# Patient Record
Sex: Male | Born: 1950
Health system: Southern US, Community
[De-identification: ages and names within clinical notes are randomized; demographics above are authoritative.]

## PROBLEM LIST (undated history)

## (undated) DIAGNOSIS — E669 Obesity, unspecified: Secondary | ICD-10-CM

## (undated) DIAGNOSIS — M1990 Unspecified osteoarthritis, unspecified site: Secondary | ICD-10-CM

## (undated) DIAGNOSIS — Z7901 Long term (current) use of anticoagulants: Secondary | ICD-10-CM

## (undated) DIAGNOSIS — Z720 Tobacco use: Secondary | ICD-10-CM

## (undated) DIAGNOSIS — K4090 Unilateral inguinal hernia, without obstruction or gangrene, not specified as recurrent: Secondary | ICD-10-CM

## (undated) DIAGNOSIS — E785 Hyperlipidemia, unspecified: Secondary | ICD-10-CM

## (undated) DIAGNOSIS — Z86010 Personal history of colonic polyps: Secondary | ICD-10-CM

## (undated) DIAGNOSIS — M1A9XX0 Chronic gout, unspecified, without tophus (tophi): Secondary | ICD-10-CM

## (undated) DIAGNOSIS — R42 Dizziness and giddiness: Secondary | ICD-10-CM

## (undated) DIAGNOSIS — I10 Essential (primary) hypertension: Secondary | ICD-10-CM

## (undated) DIAGNOSIS — Z860101 Personal history of adenomatous and serrated colon polyps: Secondary | ICD-10-CM

## (undated) DIAGNOSIS — Z8719 Personal history of other diseases of the digestive system: Secondary | ICD-10-CM

## (undated) DIAGNOSIS — K219 Gastro-esophageal reflux disease without esophagitis: Secondary | ICD-10-CM

## (undated) DIAGNOSIS — M109 Gout, unspecified: Secondary | ICD-10-CM

## (undated) DIAGNOSIS — E7800 Pure hypercholesterolemia, unspecified: Secondary | ICD-10-CM

## (undated) DIAGNOSIS — R112 Nausea with vomiting, unspecified: Secondary | ICD-10-CM

## (undated) DIAGNOSIS — G4733 Obstructive sleep apnea (adult) (pediatric): Secondary | ICD-10-CM

## (undated) HISTORY — DX: Pure hypercholesterolemia, unspecified: E78.00

## (undated) HISTORY — PX: COLONOSCOPY: SHX174

## (undated) HISTORY — DX: Gout, unspecified: M10.9

## (undated) HISTORY — DX: Tobacco use: Z72.0

## (undated) HISTORY — DX: Unilateral inguinal hernia, without obstruction or gangrene, not specified as recurrent: K40.90

## (undated) HISTORY — DX: Personal history of other diseases of the digestive system: Z87.19

## (undated) HISTORY — DX: Dizziness and giddiness: R42

## (undated) HISTORY — DX: Unspecified osteoarthritis, unspecified site: M19.90

## (undated) HISTORY — DX: Obesity, unspecified: E66.9

## (undated) HISTORY — DX: Essential (primary) hypertension: I10

## (undated) HISTORY — PX: CHOLECYSTECTOMY: SHX55

---

## 1993-06-06 HISTORY — PX: LAPAROSCOPIC CHOLECYSTECTOMY: SUR755

## 1994-05-19 ENCOUNTER — Encounter: Payer: Self-pay | Admitting: Internal Medicine

## 2004-09-13 ENCOUNTER — Ambulatory Visit: Payer: Self-pay | Admitting: Internal Medicine

## 2004-11-04 ENCOUNTER — Encounter (INDEPENDENT_AMBULATORY_CARE_PROVIDER_SITE_OTHER): Payer: Self-pay | Admitting: *Deleted

## 2004-11-04 ENCOUNTER — Ambulatory Visit: Payer: Self-pay | Admitting: Internal Medicine

## 2005-06-06 HISTORY — PX: COLONOSCOPY: SHX174

## 2006-05-14 ENCOUNTER — Emergency Department (HOSPITAL_COMMUNITY): Admission: EM | Admit: 2006-05-14 | Discharge: 2006-05-14 | Payer: Self-pay | Admitting: Family Medicine

## 2008-08-20 ENCOUNTER — Ambulatory Visit: Payer: Self-pay | Admitting: Internal Medicine

## 2008-08-20 LAB — CONVERTED CEMR LAB
Albumin: 4.1 g/dL (ref 3.5–5.2)
Alkaline Phosphatase: 64 units/L (ref 39–117)
CO2: 30 meq/L (ref 19–32)
CRP, High Sensitivity: 6 — ABNORMAL HIGH (ref 0.00–5.00)
Calcium: 9.1 mg/dL (ref 8.4–10.5)
Creatinine, Ser: 0.8 mg/dL (ref 0.4–1.5)
Eosinophils Absolute: 0.1 10*3/uL (ref 0.0–0.7)
GFR calc non Af Amer: 106.22 mL/min (ref >60)
HCT: 44.9 % (ref 39.0–52.0)
HDL: 27.8 mg/dL — ABNORMAL LOW (ref >39.00)
Lymphocytes Relative: 22.4 % (ref 12.0–46.0)
MCV: 90 fL (ref 78.0–100.0)
Monocytes Absolute: 0.6 10*3/uL (ref 0.1–1.0)
Monocytes Relative: 8.9 % (ref 3.0–12.0)
Neutro Abs: 4.4 10*3/uL (ref 1.4–7.7)
Platelets: 184 10*3/uL (ref 150.0–400.0)
RBC: 4.99 M/uL (ref 4.22–5.81)
Total Protein: 7 g/dL (ref 6.0–8.3)
Triglycerides: 145 mg/dL (ref 0.0–149.0)
WBC: 6.6 10*3/uL (ref 4.5–10.5)

## 2008-08-29 ENCOUNTER — Ambulatory Visit: Payer: Self-pay | Admitting: Internal Medicine

## 2008-08-29 ENCOUNTER — Encounter: Payer: Self-pay | Admitting: Internal Medicine

## 2008-08-29 DIAGNOSIS — E785 Hyperlipidemia, unspecified: Secondary | ICD-10-CM | POA: Insufficient documentation

## 2008-08-29 DIAGNOSIS — F17200 Nicotine dependence, unspecified, uncomplicated: Secondary | ICD-10-CM

## 2008-10-09 ENCOUNTER — Ambulatory Visit: Payer: Self-pay

## 2008-10-09 ENCOUNTER — Ambulatory Visit: Payer: Self-pay | Admitting: Internal Medicine

## 2008-11-18 ENCOUNTER — Ambulatory Visit: Payer: Self-pay | Admitting: Internal Medicine

## 2008-11-18 DIAGNOSIS — E7800 Pure hypercholesterolemia, unspecified: Secondary | ICD-10-CM

## 2008-11-19 LAB — CONVERTED CEMR LAB
Cholesterol: 137 mg/dL (ref 0–200)
LDL Cholesterol: 79 mg/dL (ref 0–99)
Total Bilirubin: 1.1 mg/dL (ref 0.3–1.2)
Total CHOL/HDL Ratio: 4
Triglycerides: 117 mg/dL (ref 0.0–149.0)

## 2008-11-24 ENCOUNTER — Ambulatory Visit: Payer: Self-pay | Admitting: Internal Medicine

## 2009-03-18 ENCOUNTER — Encounter (INDEPENDENT_AMBULATORY_CARE_PROVIDER_SITE_OTHER): Payer: Self-pay | Admitting: *Deleted

## 2009-10-27 ENCOUNTER — Encounter (INDEPENDENT_AMBULATORY_CARE_PROVIDER_SITE_OTHER): Payer: Self-pay | Admitting: *Deleted

## 2010-03-23 ENCOUNTER — Ambulatory Visit: Payer: Self-pay | Admitting: Sports Medicine

## 2010-03-23 DIAGNOSIS — M545 Low back pain: Secondary | ICD-10-CM

## 2010-06-27 ENCOUNTER — Encounter: Payer: Self-pay | Admitting: Sports Medicine

## 2010-07-05 ENCOUNTER — Telehealth: Payer: Self-pay | Admitting: Internal Medicine

## 2010-07-08 NOTE — Letter (Signed)
Summary: Colonoscopy Date Change Letter  [facility]  [address] [place], [place] [address]   Phone: [phone]  Fax: [phone]      Oct 27, 2009 MRN: [id number]   Jim Nguyen [address] [place], [place]  [address]   Dear [name],   Previously you were recommended to have a repeat colonoscopy around this time. Your chart was recently reviewed by Dr. Hedwig Morton. Juanda Chance of  Gastroenterology. Follow up colonoscopy is now recommended in June 2013. This revised recommendation is based on current, nationally recognized guidelines for colorectal cancer screening and polyp surveillance. These guidelines are endorsed by the American Cancer Society, [employer] on Colorectal Cancer as well as numerous other major medical organizations.  Please understand that our recommendation assumes that you do not have any new symptoms such as bleeding, a change in bowel habits, anemia, or significant abdominal discomfort. If you do have any concerning GI symptoms or want to discuss the guideline recommendations, please call to arrange an office visit at your earliest convenience. Otherwise we will keep you in our reminder system and contact you 1-2 months prior to the date listed above to schedule your next colonoscopy.  Thank you,  Hedwig Morton. Juanda Chance, M.D.  [facility] [phone]

## 2010-07-08 NOTE — Assessment & Plan Note (Signed)
Summary: NP GOLFER PRE TOURNAMENT/LOW BACK PAIN/MJD   Vital Signs:  Patient profile:   60 year old male Height:      70 inches Weight:      220 pounds BMI:     31.68 BP sitting:   187 / 95  Vitals Entered By: Lillia Pauls CMA (March 23, 2010 11:57 AM)  History of Present Illness: Low back pain x 2 weeks following multiple golf swings.  Felt this may have arose from trying new clubs Tried rest, and chiropracter and accupuncture.  Also has been taking a 12 day presnisone taper prescribed in past by Dr Farris Has  Back pain mostly 20-30 swings into a golf round. Has been unable to complete a round of golf since the injury.  However he has been resting the back and pain level is now 1/10 no sciatica or red flags   Has big golf tournament this Friday, Saturday and Sunday that he wants to complete.   Current Medications (verified): 1)  Fish Oil   Oil (Fish Oil) .... 1200mg  3 Tabs Daily 2)  Aspirin 81 Mg  Tabs (Aspirin) .Marland Kitchen.. 1 Once Daily 3)  Lipitor 10 Mg Tabs (Atorvastatin Calcium) .... Take One Tablet By Mouth Daily. 4)  Mobic 15 Mg Tabs (Meloxicam) .Marland Kitchen.. 1 By Mouth Daily X10 Days Then As Needed Daily For Pain  Allergies (verified): No Known Drug Allergies  Past History:  Past Medical History: Last updated: 11/24/2008 1. Obesity 2. H/o galsstone s/p cholecystectomy 3. Tobacco use 4. Hyperlipidemia 5. Hypertension 6. ETT 5/10: walked 9:30 no ST-T abnormalities  Review of Systems  The patient denies anorexia, fever, weight loss, syncope, and abdominal pain.    Physical Exam  General:  Vs noted. BP elevated.  Msk:  No deformaties noted  and no skin changes noted.  Mildy TTP over the right SI joint. Paraspinus muscles are mildly tight. Straight leg and contralateral raise tests are neg.  FABER neg, Pretzle stretch has minor twinge in right SI joint.  Stork test is mildly pos in right SI  norm strength and motion  no weakness or signs of discogenic pain   Impression  & Recommendations:  Problem # 1:  LOW BACK PAIN, ACUTE (ICD-724.2) Assessment New SI joint irritation.  Plan Mobic x10 days in conjunction with pred taper.  Showed pretzle stretch as well as hip rotation stretch.  Encourage heat packs and massage.  See instructions.  Discussed red flags with pt.  Will follow up as needed.   His updated medication list for this problem includes:    Aspirin 81 Mg Tabs (Aspirin) .Marland Kitchen... 1 once daily    Mobic 15 Mg Tabs (Meloxicam) .Marland Kitchen... 1 by mouth daily x10 days then as needed daily for pain  Complete Medication List: 1)  Fish Oil Oil (Fish oil) .... 1200mg  3 tabs daily 2)  Aspirin 81 Mg Tabs (Aspirin) .Marland Kitchen.. 1 once daily 3)  Lipitor 10 Mg Tabs (Atorvastatin calcium) .... Take one tablet by mouth daily. 4)  Mobic 15 Mg Tabs (Meloxicam) .Marland Kitchen.. 1 by mouth daily x10 days then as needed daily for pain  Patient Instructions: 1)  Thank you for seeing me today. 2)  Do those stretches that we talked about... standing hip rotation and the pretzel stretch.  3)  Take the mobic daily x10 days. 4)  Apply ice for 10 mins following playing.  5)  Try heat wraps to the backs as needed and each nite of tournament. 6)  Sports massage may be helpful.  7)  Come back if not improved.  8)  Good luck this weekend. Prescriptions: MOBIC 15 MG TABS (MELOXICAM) 1 by mouth daily x10 days then as needed daily for pain  #30 x 0   Entered by:   Clementeen Graham MD   Authorized by:   Enid Baas MD   Signed by:   Clementeen Graham MD on 03/23/2010   Method used:   Electronically to        [facility] [address] [phone]* (retail)       [address]       [place], [place]  [address]       Ph: [phone] or [phone]       Fax: [phone]   RxID:   [id number]    Orders Added: 1)  New Patient Level III [phone]

## 2010-07-14 NOTE — Progress Notes (Signed)
Summary: pt's wife calling re plavix***spouse 2nd call**   Phone Note Call from Patient   Caller: Jim Nguyen [phone] Reason for Call: Talk to Nurse Summary of Call: pt received plavix from [facility] with a bill for $88,  they don't use [facility], they use [facility] where she can get plavix for $4 a month, they need to know what to do since they didn't request the order-she called [facility] and was told we sent it in- Initial call taken by: Glynda Jaeger,  July 05, 2010 8:38 AM  Follow-up for Phone Call        Phone Call Completed SPOKE WITH PT'S WIFE  ACTUALLY MED WAS LIPITOR 10 MG NOT PLAVIX .INFORMED WIFE WOULD CALL [facility] TO SEE IF MED COULD BE MAILED BACK.    Follow-up by: Scherrie Bateman, LPN,  July 05, 2010 5:02 PM  Additional Follow-up for Phone Call Additional follow up Details #1::        Phone Call Completed PER [facility]  WILL MAIL PT ENVELOPE  TO PUT MED INTO  TO MAIL BACK TO [place]. PT'S WIFE AWARE OF ABOVE. Additional Follow-up by: Scherrie Bateman, LPN,  July 05, 2010 5:07 PM

## 2011-03-06 ENCOUNTER — Other Ambulatory Visit: Payer: Self-pay | Admitting: Internal Medicine

## 2011-03-07 ENCOUNTER — Emergency Department (HOSPITAL_COMMUNITY)
Admission: EM | Admit: 2011-03-07 | Discharge: 2011-03-07 | Disposition: A | Discharge status: Home / Self Care | Payer: PRIVATE HEALTH INSURANCE | Attending: Emergency Medicine | Admitting: Emergency Medicine

## 2011-03-07 ENCOUNTER — Emergency Department (HOSPITAL_COMMUNITY): Payer: PRIVATE HEALTH INSURANCE

## 2011-03-07 DIAGNOSIS — E7800 Pure hypercholesterolemia, unspecified: Secondary | ICD-10-CM | POA: Insufficient documentation

## 2011-03-07 DIAGNOSIS — H5500 Unspecified nystagmus: Secondary | ICD-10-CM | POA: Insufficient documentation

## 2011-03-07 DIAGNOSIS — R42 Dizziness and giddiness: Secondary | ICD-10-CM | POA: Insufficient documentation

## 2011-03-07 DIAGNOSIS — R61 Generalized hyperhidrosis: Secondary | ICD-10-CM | POA: Insufficient documentation

## 2011-03-07 DIAGNOSIS — Z79899 Other long term (current) drug therapy: Secondary | ICD-10-CM | POA: Insufficient documentation

## 2011-03-07 DIAGNOSIS — R112 Nausea with vomiting, unspecified: Secondary | ICD-10-CM | POA: Insufficient documentation

## 2011-03-07 LAB — CBC
HCT: 44.8 % (ref 39.0–52.0)
Hemoglobin: 15.9 g/dL (ref 13.0–17.0)
MCH: 32 pg (ref 26.0–34.0)
MCV: 90.1 fL (ref 78.0–100.0)
RDW: 13.2 % (ref 11.5–15.5)

## 2011-03-07 LAB — BASIC METABOLIC PANEL
BUN: 14 mg/dL (ref 6–23)
Chloride: 104 mEq/L (ref 96–112)
Creatinine, Ser: 0.82 mg/dL (ref 0.50–1.35)
Glucose, Bld: 152 mg/dL — ABNORMAL HIGH (ref 70–99)
Potassium: 4.1 mEq/L (ref 3.5–5.1)

## 2011-03-07 LAB — CK TOTAL AND CKMB (NOT AT ARMC): Relative Index: 1.9 (ref 0.0–2.5)

## 2011-03-07 MED ORDER — IOHEXOL 300 MG/ML  SOLN: 65.0000 mL | Freq: Once | INTRAMUSCULAR | Status: DC | PRN

## 2011-03-07 MED ORDER — IOHEXOL 350 MG/ML SOLN: 70.0000 mL | Freq: Once | INTRAVENOUS | Status: DC | PRN

## 2011-03-08 ENCOUNTER — Ambulatory Visit (HOSPITAL_COMMUNITY)
Admission: RE | Admit: 2011-03-08 | Discharge: 2011-03-08 | Disposition: A | Discharge status: Home / Self Care | Payer: PRIVATE HEALTH INSURANCE | Source: Ambulatory Visit | Attending: Emergency Medicine | Admitting: Emergency Medicine

## 2011-03-08 ENCOUNTER — Telehealth: Payer: Self-pay | Admitting: Internal Medicine

## 2011-03-08 DIAGNOSIS — F17200 Nicotine dependence, unspecified, uncomplicated: Secondary | ICD-10-CM | POA: Insufficient documentation

## 2011-03-08 DIAGNOSIS — R42 Dizziness and giddiness: Secondary | ICD-10-CM

## 2011-03-08 DIAGNOSIS — E785 Hyperlipidemia, unspecified: Secondary | ICD-10-CM | POA: Insufficient documentation

## 2011-03-08 NOTE — Telephone Encounter (Signed)
Pt wife calling to schedule appt for [facility] w/ Dr. Gala Romney. Pt was made aware that Dr. Gala Romney doesn't have any available appts and I could schedule pt w/ a PA. Pt wife refused and continued to state "so what you are saying is Dr. Gala Romney doesn't want to see my husband". Pt was very upset and not satisfied that pt could not schedule appt w Dr. Gala Romney this week. Please return pt call to discuss further.

## 2011-03-08 NOTE — Telephone Encounter (Signed)
Spoke with pt, he understands that dr bensimhon is usually in the hosp now with CHF. The pt would like to see someone. He was seen in the ER yesterday for dizziness. He also reports times that it feels his heart is beating irregular. He is therefore scheduled to see dr [place] tomorrow. Deliah Goody

## 2011-03-09 ENCOUNTER — Encounter: Payer: Self-pay | Admitting: Cardiology

## 2011-03-09 ENCOUNTER — Ambulatory Visit (INDEPENDENT_AMBULATORY_CARE_PROVIDER_SITE_OTHER): Payer: PRIVATE HEALTH INSURANCE | Admitting: Cardiology

## 2011-03-09 VITALS — BP 152/90 | HR 60 | Ht 71.0 in | Wt 228.0 lb

## 2011-03-09 DIAGNOSIS — E7800 Pure hypercholesterolemia, unspecified: Secondary | ICD-10-CM

## 2011-03-09 DIAGNOSIS — IMO0001 Reserved for inherently not codable concepts without codable children: Secondary | ICD-10-CM | POA: Insufficient documentation

## 2011-03-09 DIAGNOSIS — R42 Dizziness and giddiness: Secondary | ICD-10-CM

## 2011-03-09 DIAGNOSIS — F17200 Nicotine dependence, unspecified, uncomplicated: Secondary | ICD-10-CM

## 2011-03-09 DIAGNOSIS — R030 Elevated blood-pressure reading, without diagnosis of hypertension: Secondary | ICD-10-CM

## 2011-03-09 NOTE — Patient Instructions (Signed)
Stop smoking   Work on [employer] with goal of weight loss. Reduce carbohydrates and sweets. Follow a mediterranean diet.  Regular aerobic exercise 30-40 minutes a day.  Keep a diary of your blood pressure readings.  I will see you again in 3 weeks with fasting lab work.

## 2011-03-09 NOTE — Assessment & Plan Note (Signed)
He has been on chronic statin therapy. He has not had any regular followup of this. When he returns in 3 weeks we will check a fasting lipid level and hepatic function. Since he also has a family history of hemochromatosis we will check a ferritin level. We will also check a TSH.

## 2011-03-09 NOTE — Assessment & Plan Note (Signed)
Blood pressure is elevated today. He reports that when he has checked his blood pressure at home and has been normal. At this point I've recommended lifestyle modification with tobacco cessation, low-sodium, heart healthy diet, and regular aerobic exercise. He is going to monitor his blood pressure daily and keep a diary. We will have him return in 3 weeks to review this.

## 2011-03-09 NOTE — Progress Notes (Signed)
   Jim Nguyen Date of Birth: [birth year]   History of Present Illness: Jim Nguyen is seen today as a work in. He is a pleasant 60 year old white male with history of tobacco use and hyperlipidemia. On Monday he reports that he developed a sensation that his left ear was clogged. While taking a shower he developed dizziness that persisted for about 2 hours. He became very sweaty and developed nausea and vomited once. His wife checked his blood pressure and it was elevated to 188/102. He was seen in the emergency department. He was found to have a nystagmus which resolved. He had a cranial CT and MRI which were negative. He also had an echocardiogram which was unremarkable. He has had no recurrent symptoms. He denies any prior history of hypertension. He does not check his blood pressure regularly. He also doesn't seek regular medical care but seeks specialty care when needed. He did have an excess stress test here in 2010 which was normal.  Current Outpatient Prescriptions on File Prior to Visit  Medication Sig Dispense Refill  . LIPITOR 10 MG tablet TAKE 1 TABLET EVERY DAY  90 tablet  0    No Known Allergies  Past Medical History  Diagnosis Date  . Vertigo   . Hypercholesterolemia   . Obesity   . History of gallstones   . Tobacco user   . Hypertension     Past Surgical History  Procedure Date  . Cholecystectomy     History  Smoking status  . Current Everyday Smoker -- 0.5 packs/day  Smokeless tobacco  . Not on file    History  Alcohol Use  . 1.2 oz/week  . 2 Glasses of wine per week    Occas.    History reviewed. No pertinent family history.  Review of Systems: The review of systems is positive for sedentary lifestyle. He plans to quit smoking. All other systems were reviewed and are negative.  Physical Exam: BP 152/90  Pulse 60  Ht 5\' 11"  (1.803 m)  Wt 228 lb (103.42 kg)  BMI 31.80 kg/m2 He is an overweight white male in no acute distress.The patient is  alert and oriented x 3.  The mood and affect are normal.  The skin is warm and dry.  Color is normal.  The HEENT exam reveals that the sclera are nonicteric.  Pupils equal round reactive. There is no nystagmus. The mucous membranes are moist.  The carotids are 2+ without bruits.  There is no thyromegaly.  There is no JVD.  The lungs are clear.  The chest wall is non tender.  The heart exam reveals a regular rate with a normal S1 and S2.  There are no murmurs, gallops, or rubs.  The PMI is not displaced.   Abdominal exam reveals good bowel sounds.  There is no guarding or rebound.  There is no hepatosplenomegaly or tenderness.  There are no masses.  Exam of the legs reveal no clubbing, cyanosis, or edema.  The legs are without rashes.  The distal pulses are intact.  Cranial nerves II - XII are intact.  Motor and sensory functions are intact.  The gait is normal.  LABORATORY DATA: ECG demonstrates normal sinus rhythm with a normal ECG. Echocardiogram was reviewed and showed mild left atrial margin. It was otherwise normal.  Assessment / Plan:

## 2011-03-09 NOTE — Assessment & Plan Note (Signed)
Patient states that he has stopped smoking. Encouraged him with this effort.

## 2011-03-09 NOTE — Assessment & Plan Note (Signed)
Recent episode is most likely acute vertigo. He possibly could have had a posterior TIA. He will continue with antiplatelet therapy with aspirin. We'll monitor closely for any recurrent symptoms.

## 2011-03-16 NOTE — Consult Note (Signed)
NAMEADYAN, PALAU NO.:  [id number]  MEDICAL RECORD NO.:  [id number]  LOCATION:  [facility]                         FACILITY:  [facility]  PHYSICIAN:  Thana Farr, MD    DATE OF BIRTH:  [birth year]  DATE OF CONSULTATION: DATE OF DISCHARGE:  03/07/2011                                CONSULTATION   REASON FOR CONSULTATION:  Nystagmus.  HISTORY OF PRESENT ILLNESS:  This is a 60 year old Caucasian male with past medical history of vertigo, cholecystectomy, and hypercholesterolemia.  The patient does smoke one-half pack per day. Drinks alcohol occasionally, but states he does not do illicit drugs. The patient awoke feeling his normal self this morning with the exception of fullness in his left ear.  The patient stepped into his shower and noted that he felt slightly off balance, but noted no vertigo or vertiginous sensation.  The patient took his normal shower, got out of the shower, continued to feel slightly off balance and fullness in his left ear.  The patient shaved himself, got dressed, approximately 10 minutes later noted he was very diaphoretic.  His wife also noted that he was significantly diaphoretic and took his blood pressure.  His blood pressure rang showed 188/102.  Due to fear that the patient might be having a stroke, the patient's wife decided that she is going to drive him to the hospital.  Before they came to the hospital, she did give him an orange juice thinking  his blood glucose might be low.  After drinking the orange juice the patient became nauseous and vomited.  He did state that it was mostly a gastric issue and it was not secondary to his dizziness.  Upon entering the emergency department, CT of his head was negative.  His exam was normal with exception of the ED physician noting left-beating nystagmus.  For this reason, Neuro was called and it was recommended that they obtain an MRI of brain without contrast.  At the time of consultation,  the patient's MRI of brain has come back and is within normal limits showing no acute infarct.  The patient is also back to his baseline and has no residual symptoms.  PAST MEDICAL HISTORY:  As noted above.  MEDICATIONS: 1. Aspirin 81 mg daily. 2. Lipitor 10 mg daily.  ALLERGIES:  To date, he has no known allergies.  SOCIAL HISTORY:  He smokes approximately one-half pack per day.  Drinks occasional alcohol.  Does not do any illicit drugs.  Lives with his wife.  REVIEW OF SYSTEMS:  Negative with exception above.  PHYSICAL EXAMINATION:  VITAL SIGNS:  Blood pressure is 143/68, pulse 72, respirations 18, temperature 98.2. GENERAL:  The patient is alert and oriented x3, carries out 2 and 3-step commands without difficulty. NEUROLOGIC:  Pupils are equal, round, and reactive to light and accommodating.  Conjugate gaze.  Extraocular movements are grossly intact.  Visual fields grossly intact.  Face is symmetrical.  Tongue is midline.  Uvula is midline.  Speech is clear.  Sensation V1-V3 is full. Shoulder shrug and head turn within normal limits.  Coordination:Finger-to-nose and heel-to-shin were smooth.  The patient's motor movement is strong, 5/5.  His deep tendon  reflexes were all 2+ throughout, and he had brisk downgoing toes.  He showed no drift in the upper and lower extremities.  His sensation in his upper extremities and lower extremities bilaterally was grossly intact.  LABORATORY DATA:  Sodium is 138, potassium 4.1, chloride 104, CO2 is 25, BUN 14, creatinine 1.82, glucose 152.  White blood cell count is 7.8, platelets 159, hemoglobin 15.9, hematocrit 44.8.  IMAGING:  MRI of head is negative.  CT of head is negative.  ASSESSMENT/RECOMMENDATIONS:  This is a pleasant 60 year old male whose past medical history includes hyperlipidemia and 2 episodes of vertigo.  The patient awoke with a fullness in his left ear.  After taking a shower, the patient started to profusely become  diaphoretic and noted imbalance. All symptoms have resolved at this time, and the patient's MRI is negative.  At this time, we cannot fully rule out brainstem transient ischemic attack.  For this reason, we would recommend: 1. CTA of head and neck  2. A 2-D echo to look for embolic source. 3. Would recommend increasing aspirin to 325 p.o. daily.  Recommendation has been given to the emergency department MD and the patient will be placed in the CDU until these tests are done.  If these tests show any abnormalities, further workup will be needed.  If workup is negative, the patient may be discharged home and follow up with his primary care doctor as an outpatient.  Dr. Thad Ranger seen and evaluated the patient and agrees with the above-mentioned exam.     Felicie Morn, PA-C   ______________________________ Thana Farr, MD    DS/MEDQ  D:  03/07/2011  T:  03/08/2011  Job:  [id number]  Electronically Signed by Felicie Morn PA-C on 03/08/2011 12:17:19 PM Electronically Signed by Thana Farr MD on 03/16/2011 01:52:25 PM

## 2011-03-29 ENCOUNTER — Telehealth: Payer: Self-pay | Admitting: *Deleted

## 2011-03-29 ENCOUNTER — Other Ambulatory Visit (INDEPENDENT_AMBULATORY_CARE_PROVIDER_SITE_OTHER): Payer: PRIVATE HEALTH INSURANCE | Admitting: *Deleted

## 2011-03-29 DIAGNOSIS — E7800 Pure hypercholesterolemia, unspecified: Secondary | ICD-10-CM

## 2011-03-29 DIAGNOSIS — R030 Elevated blood-pressure reading, without diagnosis of hypertension: Secondary | ICD-10-CM

## 2011-03-29 LAB — HEPATIC FUNCTION PANEL
ALT: 35 U/L (ref 0–53)
Alkaline Phosphatase: 55 U/L (ref 39–117)
Total Bilirubin: 1.2 mg/dL (ref 0.3–1.2)

## 2011-03-29 LAB — LIPID PANEL: Cholesterol: 125 mg/dL (ref 0–200)

## 2011-03-29 NOTE — Telephone Encounter (Signed)
Message copied by Lorayne Bender on Tue Mar 29, 2011  2:26 PM ------      Message from: [place], PETER M      Created: Tue Mar 29, 2011 12:34 PM       Chemistries are normal. Normal ferritin- no iron overload. Lipids are excellent except low HDL.      Theron Arista [place]

## 2011-03-29 NOTE — Telephone Encounter (Signed)
Notified of lab results. Will seen in office tomorrow

## 2011-03-30 ENCOUNTER — Ambulatory Visit (INDEPENDENT_AMBULATORY_CARE_PROVIDER_SITE_OTHER): Payer: PRIVATE HEALTH INSURANCE | Admitting: Cardiology

## 2011-03-30 ENCOUNTER — Encounter: Payer: Self-pay | Admitting: Cardiology

## 2011-03-30 VITALS — BP 135/85 | HR 59 | Ht 70.0 in | Wt 228.0 lb

## 2011-03-30 DIAGNOSIS — E7800 Pure hypercholesterolemia, unspecified: Secondary | ICD-10-CM

## 2011-03-30 DIAGNOSIS — Z720 Tobacco use: Secondary | ICD-10-CM

## 2011-03-30 DIAGNOSIS — R030 Elevated blood-pressure reading, without diagnosis of hypertension: Secondary | ICD-10-CM

## 2011-03-30 DIAGNOSIS — E785 Hyperlipidemia, unspecified: Secondary | ICD-10-CM

## 2011-03-30 DIAGNOSIS — F17200 Nicotine dependence, unspecified, uncomplicated: Secondary | ICD-10-CM

## 2011-03-30 NOTE — Progress Notes (Signed)
   Jim Nguyen Date of Birth: March 02, 1951   History of Present Illness: Jim Nguyen is seen today for a followup visit. He reports that he is feeling very well. He quit smoking on October 1. He brings a list of his blood pressure readings over the past 3 weeks. Typically his blood pressure readings are from 118 - 140 systolic. He has had rare readings over 140. Diastolic readings are all normal. Pulse rate ranges from 51-72. He has not started his exercise program yet but does plan to do so. He has had no further dizziness or vertigo. He denies any chest pain.  Current Outpatient Prescriptions on File Prior to Visit  Medication Sig Dispense Refill  . aspirin 81 MG tablet Take 81 mg by mouth 2 (two) times daily.       Marland Kitchen LIPITOR 10 MG tablet TAKE 1 TABLET EVERY DAY  90 tablet  0  . Omega-3 Fatty Acids (FISH OIL PO) Take by mouth.          No Known Allergies  Past Medical History  Diagnosis Date  . Vertigo   . Hypercholesterolemia   . Obesity   . History of gallstones   . Tobacco user   . Hypertension     Past Surgical History  Procedure Date  . Cholecystectomy     History  Smoking status  . Current Everyday Smoker -- 0.5 packs/day  Smokeless tobacco  . Not on file    History  Alcohol Use  . 1.2 oz/week  . 2 Glasses of wine per week    Occas.    No family history on file.  Review of Systems: As noted in history of present illness. All other systems were reviewed and are negative.  Physical Exam: BP 135/85  Pulse 59  Ht 5\' 10"  (1.778 m)  Wt 228 lb (103.42 kg)  BMI 32.71 kg/m2 He is an overweight white male in no acute distress.The patient is alert and oriented x 3.  The mood and affect are normal.  The skin is warm and dry.  Color is normal.  The HEENT exam reveals that the sclera are nonicteric.  Pupils equal round reactive. There is no nystagmus. The mucous membranes are moist.  The carotids are 2+ without bruits.  There is no thyromegaly.  There is no JVD.   The lungs are clear.  The chest wall is non tender.  The heart exam reveals a regular rate with a normal S1 and S2.  There are no murmurs, gallops, or rubs.  The PMI is not displaced.   Abdominal exam reveals good bowel sounds.  There is no guarding or rebound.  There is no hepatosplenomegaly or tenderness.  There are no masses.  Exam of the legs reveal no clubbing, cyanosis, or edema.  The legs are without rashes.  The distal pulses are intact.  Cranial nerves II - XII are intact.  Motor and sensory functions are intact.  The gait is normal.  LABORATORY DATA: Lab Results  Component Value Date   CHOL 125 03/29/2011   HDL 33.50* 03/29/2011   LDLCALC 63 03/29/2011   TRIG 141.0 03/29/2011   CHOLHDL 4 03/29/2011      Assessment / Plan:

## 2011-03-30 NOTE — Assessment & Plan Note (Signed)
I congratulated him on his smoking cessation.

## 2011-03-30 NOTE — Patient Instructions (Signed)
Congratulations on stopping smoking.  Continue with your lifestyle modifications including diet and exercise.  I will see you again in 6 months.

## 2011-03-30 NOTE — Assessment & Plan Note (Signed)
Blood pressure readings are acceptable. I think this will improve further with dedication to his exercise program and diet. He'll follow up again in 6 months.

## 2011-03-30 NOTE — Assessment & Plan Note (Signed)
His lipid parameters are excellent on Lipitor. His HDL is a low. This should improve with smoking cessation, exercise, and weight loss.

## 2011-06-09 ENCOUNTER — Other Ambulatory Visit: Payer: Self-pay

## 2011-06-09 MED ORDER — ATORVASTATIN CALCIUM 10 MG PO TABS: 10.0000 mg | ORAL_TABLET | Freq: Every day | ORAL | Status: DC

## 2011-06-10 ENCOUNTER — Telehealth: Payer: Self-pay | Admitting: Cardiology

## 2011-06-10 ENCOUNTER — Other Ambulatory Visit: Payer: Self-pay | Admitting: Cardiology

## 2011-06-10 MED ORDER — ATORVASTATIN CALCIUM 10 MG PO TABS: 10.0000 mg | ORAL_TABLET | Freq: Every day | ORAL | Status: DC

## 2011-06-10 NOTE — Telephone Encounter (Signed)
Follow-up:     Patient is calling to follow-up on his past two calls.

## 2011-06-10 NOTE — Telephone Encounter (Signed)
Refill completed.

## 2011-06-10 NOTE — Telephone Encounter (Signed)
New Problem:    Patient called in and he needs a refill of his atorvastatin (LIPITOR) 10 MG tablet but he needs it to be Lipitor specifically because he has a card that allows him to receive Lipitor at $4.00 a refill for the next two years.

## 2011-06-10 NOTE — Telephone Encounter (Signed)
New Msg: pt is out of medication please call refill in today.

## 2011-06-13 ENCOUNTER — Other Ambulatory Visit: Payer: Self-pay | Admitting: *Deleted

## 2011-06-14 ENCOUNTER — Other Ambulatory Visit: Payer: Self-pay | Admitting: *Deleted

## 2011-08-18 ENCOUNTER — Encounter: Payer: Self-pay | Admitting: Internal Medicine

## 2011-09-06 ENCOUNTER — Other Ambulatory Visit: Payer: Self-pay | Admitting: *Deleted

## 2011-09-12 ENCOUNTER — Telehealth: Payer: Self-pay | Admitting: Cardiology

## 2011-09-12 ENCOUNTER — Other Ambulatory Visit: Payer: Self-pay | Admitting: *Deleted

## 2011-09-12 NOTE — Telephone Encounter (Signed)
Spoke with pharmacy and they are going to refill the medication

## 2011-09-12 NOTE — Telephone Encounter (Signed)
New Problem:    Patient called in needing a refill of his atorvastatin (LIPITOR) 10 MG tablet, patient is down to his last two pills. Please call back if you have any questions.

## 2011-09-29 ENCOUNTER — Other Ambulatory Visit: Payer: Self-pay | Admitting: Dermatology

## 2011-12-12 ENCOUNTER — Other Ambulatory Visit: Payer: Self-pay | Admitting: *Deleted

## 2011-12-12 MED ORDER — ATORVASTATIN CALCIUM 10 MG PO TABS: 10.0000 mg | ORAL_TABLET | Freq: Every day | ORAL | Status: DC

## 2012-03-13 ENCOUNTER — Other Ambulatory Visit: Payer: Self-pay

## 2012-03-13 MED ORDER — ATORVASTATIN CALCIUM 10 MG PO TABS: 10.0000 mg | ORAL_TABLET | Freq: Every day | ORAL | Status: DC

## 2012-04-23 ENCOUNTER — Other Ambulatory Visit: Payer: Self-pay

## 2012-04-23 MED ORDER — ATORVASTATIN CALCIUM 10 MG PO TABS: 10.0000 mg | ORAL_TABLET | Freq: Every day | ORAL | Status: DC

## 2012-06-21 ENCOUNTER — Other Ambulatory Visit: Payer: Self-pay

## 2012-06-21 MED ORDER — ATORVASTATIN CALCIUM 10 MG PO TABS: 10.0000 mg | ORAL_TABLET | Freq: Every day | ORAL | Status: DC

## 2012-07-11 ENCOUNTER — Telehealth: Payer: Self-pay

## 2012-07-11 NOTE — Telephone Encounter (Signed)
Called to speak with patient about scheduling a follow up office visit to receive further medication refills. Spoke to patients wife and she stated she would have him call to schedule an appointment in the morning. Patients wife Verbalized understanding that without a phone call in the morning to schedule office visit we can not send in a refill.

## 2012-07-12 ENCOUNTER — Telehealth: Payer: Self-pay | Admitting: Cardiology

## 2012-07-12 MED ORDER — ATORVASTATIN CALCIUM 10 MG PO TABS: 10.0000 mg | ORAL_TABLET | Freq: Every day | ORAL | Status: DC

## 2012-07-12 NOTE — Telephone Encounter (Signed)
Spoke to patient was told he needs appointment with Dr.Jordan.Appointment scheduled with Dr.Jordan 08/08/12 at 11:45 am.Lipitor refill sent to [facility]. Patient will bring a copy of his recent lab with him to appointment.

## 2012-07-12 NOTE — Telephone Encounter (Signed)
Someone called pt yesterday and said for him to call the office he has no idea who called but he is sure they called regarding medication because he is out of meds

## 2012-08-08 ENCOUNTER — Ambulatory Visit (INDEPENDENT_AMBULATORY_CARE_PROVIDER_SITE_OTHER): Payer: PRIVATE HEALTH INSURANCE | Admitting: Cardiology

## 2012-08-08 ENCOUNTER — Encounter: Payer: Self-pay | Admitting: Cardiology

## 2012-08-08 VITALS — BP 140/84 | HR 53 | Ht 71.0 in | Wt 231.2 lb

## 2012-08-08 DIAGNOSIS — E785 Hyperlipidemia, unspecified: Secondary | ICD-10-CM

## 2012-08-08 DIAGNOSIS — E7800 Pure hypercholesterolemia, unspecified: Secondary | ICD-10-CM

## 2012-08-08 MED ORDER — ATORVASTATIN CALCIUM 10 MG PO TABS: 10.0000 mg | ORAL_TABLET | Freq: Every day | ORAL | Status: DC

## 2012-08-08 NOTE — Progress Notes (Signed)
   Jim Nguyen Date of Birth: 17-Jul-1950   History of Present Illness: [name] is seen today for a yearly followup visit. He has a history hypertension and hypercholesterolemia. He has no known cardiac disease. He reports that he quit smoking one year ago. He quit cold [place]. He is feeling very well. He denies any symptoms of chest pain, shortness of breath, or palpitations. He admits that he could be doing better with his diet and exercise. He is playing golf twice a week but really not getting much aerobic activity. He reports that his blood pressure has been doing well.  Current Outpatient Prescriptions on File Prior to Visit  Medication Sig Dispense Refill  . aspirin 81 MG tablet Take 81 mg by mouth 2 (two) times daily.       . Omega-3 Fatty Acids (FISH OIL PO) Take by mouth.         No current facility-administered medications on file prior to visit.    No Known Allergies  Past Medical History  Diagnosis Date  . Vertigo   . Hypercholesterolemia   . Obesity   . History of gallstones   . Tobacco user   . Hypertension     Past Surgical History  Procedure Laterality Date  . Cholecystectomy      History  Smoking status  . Former Smoker -- 0.50 packs/day  Smokeless tobacco  . Not on file    History  Alcohol Use  . 1.2 oz/week  . 2 Glasses of wine per week    Comment: Occas.    History reviewed. No pertinent family history.  Review of Systems: As noted in history of present illness. All other systems were reviewed and are negative.  Physical Exam: BP 140/84  Pulse 53  Ht 5\' 11"  (1.803 m)  Wt 231 lb 3.2 oz (104.872 kg)  BMI 32.26 kg/m2  SpO2 96% He is an overweight white male in no acute distress.The patient is alert and oriented x 3.    The HEENT exam  Is unremarkable. The carotids are 2+ without bruits.  There is no thyromegaly.  There is no JVD.  The lungs are clear.    The heart exam reveals a regular rate with a normal S1 and S2.  There are no  murmurs, gallops, or rubs.  The PMI is not displaced.   Abdominal exam reveals good bowel sounds.    There is no hepatosplenomegaly or tenderness.  There are no masses or bruits..  Exam of the legs reveal no clubbing, cyanosis, or edema.  The legs are without rashes.  The distal pulses are intact.  Cranial nerves II - XII are intact.  Motor and sensory functions are intact.  The gait is normal.  LABORATORY DATA: ECG today demonstrates sinus bradycardia with rate of 53 beats per minute. It is otherwise normal.    Assessment / Plan: 1. Hypercholesterolemia. He is on Lipitor and fish oil. He has a complete physical scheduled with Dr. Waynard Edwards in one month. We'll await his lab work from that visit.  2. Hypertension. Blood pressure is mildly elevated today. We'll continue to monitor. Encouraged increased aerobic activity and weight loss.  3. Tobacco abuse. I have congratulated him on smoking cessation. I'll followup again in one year.

## 2012-08-08 NOTE — Patient Instructions (Signed)
Congratulations on quitting smoking.  Get more exercise and try to lose a few pounds.  I will see you again in one year.

## 2012-08-13 ENCOUNTER — Telehealth: Payer: Self-pay

## 2012-08-13 ENCOUNTER — Other Ambulatory Visit: Payer: Self-pay

## 2012-08-13 NOTE — Telephone Encounter (Signed)
Got a refill request for Atorvastatin and called to verify that the patient needed a refill sent in because one was sent in on 08/08/12 and pharmacist verified that no they did not need a refill sent in and that patient had already picked up the Atorvastatin.

## 2012-08-15 ENCOUNTER — Telehealth: Payer: Self-pay

## 2012-08-15 NOTE — Telephone Encounter (Signed)
Got a refill request for atorvastatin and called pharmacy to verify the refill that was sent in last week was received by them. [facility] did receive refill and pt has picked up medication and has refills remaining.

## 2012-09-16 IMAGING — CT CT ANGIO NECK
1 of 9 series · 6 of 33 positions shown · IV contrast (omnipaque)
Comparison: MR and head CT 03/07/2011 [HOSPITAL].

CTA NECK

CLINICAL DATA: Sudden onset of dizziness.

CT ANGIOGRAPHY HEAD AND NECK
TECHNIQUE: Multidetector CT imaging of the head and neck was
performed using the standard protocol during bolus administration
of intravenous contrast.  Multiplanar CT image reconstructions
including MIPs were obtained to evaluate the vascular anatomy.
Carotid stenosis measurements (when applicable) are obtained
utilizing NASCET criteria, using the distal internal carotid
diameter as the denominator.
Contrast:  70 ml Omnipaque 350.

[axials · axial · 0.43mm/px · z∈[+114,+366]mm · 6 of 354 slices shown]
[im 51/354  soft-tissue]
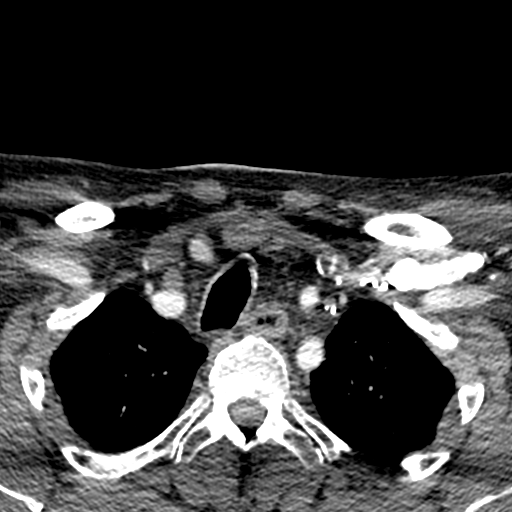
[im 101/354  bone]
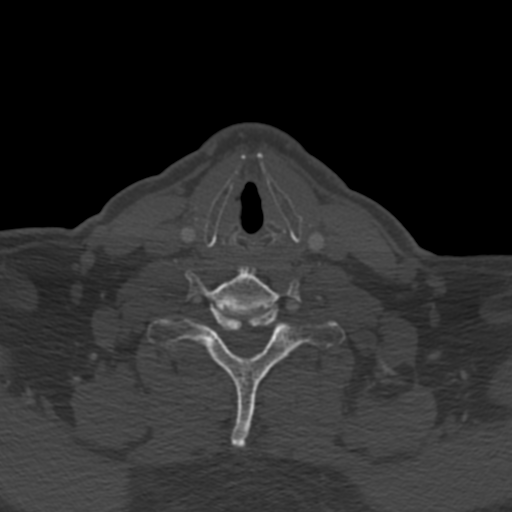
[im 152/354  soft-tissue]
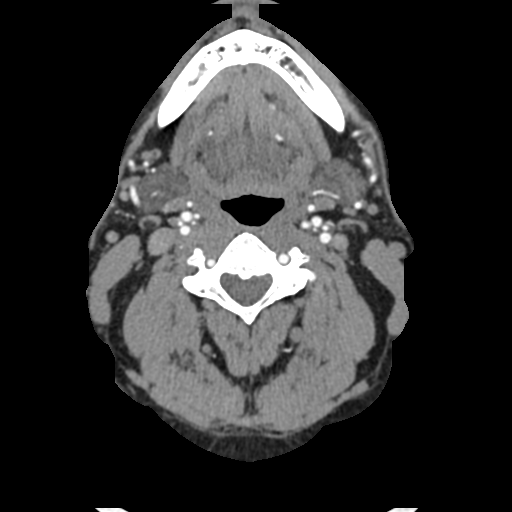
[im 202/354  bone]
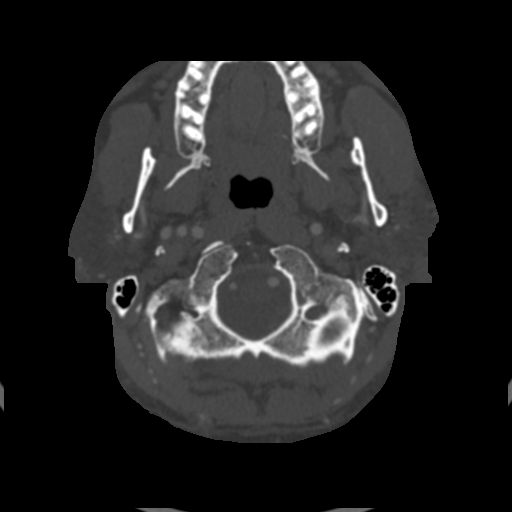
[im 253/354  soft-tissue]
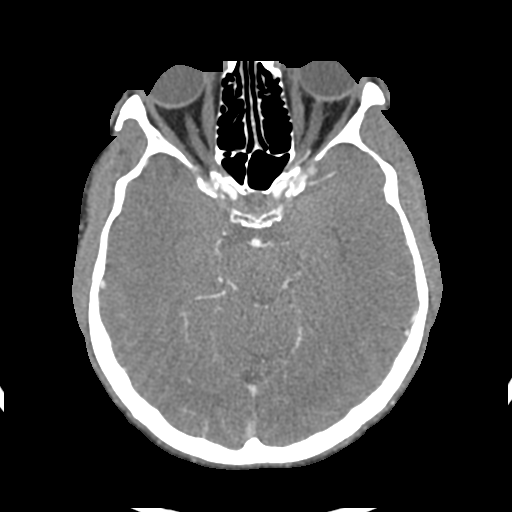
[im 303/354  bone]
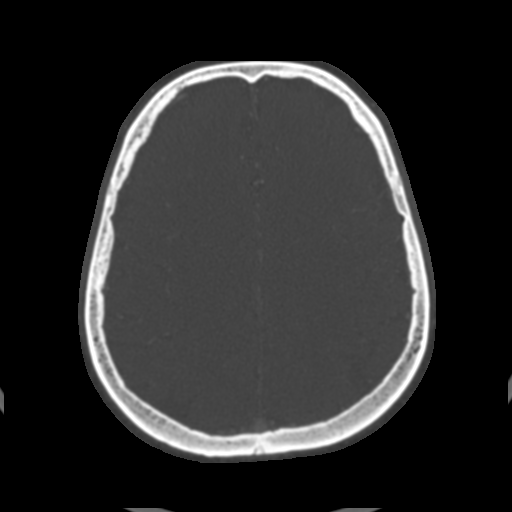

[6 of 33 positions shown; findings below may reference images not displayed]

FINDINGS: Normal configuration of origin of the great vessels from
the aortic arch.

No evidence hemodynamically significant stenosis involving either
carotid bifurcation.

Prominent ectasia of the distal vertical cervical segment of the
right internal carotid artery and the proximal to mid aspect of the
left internal carotid artery vertical cervical segment without
evidence of dissection.

Left vertebral artery is dominant.  Ectasia of the mid vertical
cervical segment right internal carotid artery at the C4-5 level.
Minimal irregularity of the right vertebral artery at the C2 level.

Cervical spondylotic changes with spinal stenosis C3-4, C4-5 and C6-
7 incompletely assessed on the present exam.

 Review of the MIP images confirms the above findings.
IMPRESSION: Mild atherosclerotic type changes without evidence hemodynamically
significant stenosis or dissection.  Please see above.

CTA HEAD
FINDINGS: No intracranial enhancing lesion.  No intracranial
hemorrhage.  No CT evidence of large acute infarct.  Small acute
infarct cannot be excluded by CT.

Minimal calcification and narrowing cavernous segment of the
internal carotid artery bilaterally.  No medium or large size
vessel significant stenosis or occlusion.  No aneurysm detected.

 Review of the MIP images confirms the above findings.
IMPRESSION: Minimal atherosclerotic type changes of the cavernous segment of
the internal carotid artery bilaterally.

## 2012-10-05 ENCOUNTER — Encounter: Payer: Self-pay | Admitting: Cardiology

## 2014-09-11 ENCOUNTER — Encounter: Payer: Self-pay | Admitting: Internal Medicine

## 2014-09-12 ENCOUNTER — Encounter: Payer: Self-pay | Admitting: Internal Medicine

## 2014-10-03 ENCOUNTER — Encounter: Payer: Self-pay | Admitting: Internal Medicine

## 2014-11-24 ENCOUNTER — Ambulatory Visit (AMBULATORY_SURGERY_CENTER): Payer: 59 | Admitting: *Deleted

## 2014-11-24 VITALS — Ht 70.0 in | Wt 230.0 lb

## 2014-11-24 DIAGNOSIS — Z1211 Encounter for screening for malignant neoplasm of colon: Secondary | ICD-10-CM

## 2014-11-24 NOTE — Progress Notes (Signed)
Patient denies any allergies to eggs or soy. Patient denies any problems with anesthesia/sedation. Patient denies any oxygen use at home and does not take any diet/weight loss medications. Pt declined EMMI information. Patient requested the MIralax prep.

## 2014-12-09 ENCOUNTER — Telehealth: Payer: Self-pay | Admitting: Internal Medicine

## 2014-12-09 NOTE — Telephone Encounter (Signed)
No charge. 

## 2014-12-10 ENCOUNTER — Encounter: Payer: PRIVATE HEALTH INSURANCE | Admitting: Internal Medicine

## 2015-01-23 ENCOUNTER — Encounter: Payer: PRIVATE HEALTH INSURANCE | Admitting: Internal Medicine

## 2015-01-28 ENCOUNTER — Telehealth: Payer: Self-pay | Admitting: Cardiology

## 2015-01-28 DIAGNOSIS — R002 Palpitations: Secondary | ICD-10-CM

## 2015-01-28 NOTE — Telephone Encounter (Signed)
NewMessage  Patient c/o Palpitations:  High priority if patient c/o lightheadedness and shortness of breath.  1. How long have you been having palpitations? 1 week  2. Are you currently experiencing lightheadedness and shortness of breath? no  3. Have you checked your BP and heart rate? (document readings) pulse is normally low, pt stated BP is fine  4. Are you experiencing any other symptoms? no

## 2015-01-28 NOTE — Telephone Encounter (Signed)
Would arrange 24 hour monitor and then follow up in office.  Peter Martinique MD, [facility]

## 2015-01-28 NOTE — Telephone Encounter (Signed)
Spoke to patient, explained Holter monitor recommendation & schedule to see provider for result review.  Will contact [employer] office in AM for set up.

## 2015-01-28 NOTE — Telephone Encounter (Signed)
Spoke to patient. Overdue to be seen by Dr. Martinique - last visit 2+ years ago.  He reports over last week or so, palpitations - he states "hard to describe". States he can feel his heart beating. This is occasional throughout the day. Most often occurs at night.  He notes no recent med changes, he has had dietary changes resultant in weight loss ~20 lbs. Notes recent life stressors - job, etc. Notes may be contributing to his symptoms.  He denies chest pain, shortness of breath, etc  ---no other symptoms.  Will route to Dr. Martinique - advise if monitor needed/duration. Pt would like to be seen in office.- recommend return OV w/ Dr. Martinique or other provider for re-establishment of care?

## 2015-01-29 ENCOUNTER — Other Ambulatory Visit: Payer: Self-pay

## 2015-01-29 ENCOUNTER — Encounter (INDEPENDENT_AMBULATORY_CARE_PROVIDER_SITE_OTHER): Payer: 59

## 2015-01-29 ENCOUNTER — Telehealth: Payer: Self-pay | Admitting: *Deleted

## 2015-01-29 DIAGNOSIS — R002 Palpitations: Secondary | ICD-10-CM

## 2015-01-29 NOTE — Telephone Encounter (Signed)
Monitor scheduled for [facility] this AM by Dalene Seltzer. Also scheduled f/u w/ Tarri Fuller on Sept 14th. Spoke w/ patient and relayed appt times/dates/location information.  Patient voiced understanding.

## 2015-02-18 ENCOUNTER — Encounter: Payer: Self-pay | Admitting: Physician Assistant

## 2015-02-18 ENCOUNTER — Ambulatory Visit (INDEPENDENT_AMBULATORY_CARE_PROVIDER_SITE_OTHER): Payer: 59 | Admitting: Physician Assistant

## 2015-02-18 VITALS — BP 160/80 | HR 73 | Ht 70.0 in | Wt 221.8 lb

## 2015-02-18 DIAGNOSIS — R002 Palpitations: Secondary | ICD-10-CM

## 2015-02-18 NOTE — Patient Instructions (Signed)
Your physician recommends that you schedule a follow-up appointment in: [place] physician has recommended that you wear an event monitor. Event monitors are medical devices that record the heart's electrical activity. Doctors most often [place] these monitors to diagnose arrhythmias. Arrhythmias are problems with the speed or rhythm of the heartbeat. The monitor is a small, portable device. You can wear one while you do your normal daily activities. This is usually used to diagnose what is causing palpitations/syncope (passing out).SCHEDULE AT [place] FOR 3 DAYS ONLY

## 2015-02-18 NOTE — Progress Notes (Signed)
Patient ID: Jim Nguyen, male   DOB: [birth year], 64 y.o.   MRN: [id number]    Date:  02/18/2015   ID:  Jim Nguyen, DOB 1950/11/14, MRN [id number]  PCP:  Jerlyn Ly, MD  Primary Cardiologist:  Martinique   chief complaint :  palpitations   History of Present Illness: Jim Nguyen is a 64 y.o. male with a history hypertension and hypercholesterolemia. He has no known cardiac disease. He reports that he quit smoking in 2013. He quit cold [place].   Patient presents today with complaints palpitations. Last night he reports his heart was racing. It last as long as 2 hours. He wore a heart monitor for 1 day   On August 24 ,during which time he had no symptoms.   He denies dizziness, shortness of breath, chest pain.   He reports being on whole 30 diet and has lost 15 pounds.   He is under a great deal of stress. 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  The patient currently denies nausea, vomiting, fever, chest pain, shortness of breath, orthopnea, dizziness, PND, cough, congestion, abdominal pain, hematochezia, melena, lower extremity edema, claudication.  Wt Readings from Last 3 Encounters:  02/18/15 221 lb 12.8 oz (100.608 kg)  11/24/14 230 lb (104.327 kg)  08/08/12 231 lb 3.2 oz (104.872 kg)     Past Medical History  Diagnosis Date  . Vertigo   . Hypercholesterolemia   . Obesity   . History of gallstones   . Tobacco user   . Hypertension     Current Outpatient Prescriptions  Medication Sig Dispense Refill  . allopurinol (ZYLOPRIM) 300 MG tablet Take 300 mg by mouth daily.    Marland Kitchen aspirin 81 MG tablet Take 81 mg by mouth daily.     Marland Kitchen atorvastatin (LIPITOR) 10 MG tablet Take 1 tablet (10 mg total) by mouth daily. 90 tablet 3   No current facility-administered medications for this visit.    Allergies:   No Known Allergies  Social History:  The patient  reports that he has been smoking Cigars.  He has never used smokeless tobacco. He reports that  he drinks about 6.0 - 7.2 oz of alcohol per week.   Family history:   Family History  Problem Relation Age of Onset  . Colon cancer Neg Hx     ROS:  Please see the history of present illness.  All other systems reviewed and negative.   PHYSICAL EXAM: VS:  BP 160/80 mmHg  Pulse 73  Ht 5\' 10"  (1.778 m)  Wt 221 lb 12.8 oz (100.608 kg)  BMI 31.83 kg/m2  obese, well developed, in no acute distress HEENT: Pupils are equal round react to light accommodation extraocular movements are intact.  Neck: no JVDNo cervical lymphadenopathy. Cardiac: Regular rate and rhythm without murmurs rubs or gallops. Lungs:  clear to auscultation bilaterally, no wheezing, rhonchi or rales Abd: soft, nontender, positive bowel sounds all quadrants, no hepatosplenomegaly Ext: no lower extremity edema.  2+ radial and dorsalis pedis pulses. Skin: warm and dry Neuro:  Grossly normal  EKG:   Normal sinus rhythm nonspecific T-wave changes.  ASSESSMENT AND PLAN:  Problem List Items Addressed This Visit    None    Visit Diagnoses    Palpitations    -  Primary    Relevant Orders    EKG 12-Lead    Cardiac event monitor       palpitations:   this certainly  could be stress related. We'll extend the heart rate monitoring to 3 days. Hopefully during that time we can capture something. Follow-up afterwards

## 2015-02-19 ENCOUNTER — Ambulatory Visit (INDEPENDENT_AMBULATORY_CARE_PROVIDER_SITE_OTHER): Payer: 59

## 2015-02-19 DIAGNOSIS — R002 Palpitations: Secondary | ICD-10-CM | POA: Diagnosis not present

## 2015-03-03 ENCOUNTER — Ambulatory Visit: Payer: 59 | Admitting: Cardiology

## 2015-03-04 ENCOUNTER — Ambulatory Visit (INDEPENDENT_AMBULATORY_CARE_PROVIDER_SITE_OTHER): Payer: PRIVATE HEALTH INSURANCE | Admitting: Physician Assistant

## 2015-03-04 VITALS — BP 136/84 | HR 60 | Ht 70.0 in | Wt 220.4 lb

## 2015-03-04 DIAGNOSIS — R002 Palpitations: Secondary | ICD-10-CM

## 2015-03-04 NOTE — Patient Instructions (Signed)
Your physician recommends that you schedule a follow-up appointment As Needed  

## 2015-03-04 NOTE — Progress Notes (Signed)
Cardiology Office Note   Date:  03/04/2015   ID:  Jim Nguyen, DOB [birth year], MRN [id number]  PCP:  Jerlyn Ly, MD  Cardiologist:  Dr. Martinique  Barrett, Rhonda, PA-C   Chief Complaint  Patient presents with  . Follow-up    48 hr holter//pt states no Sx//Felt palpitations or irregular heart beat    History of Present Illness: Jim Nguyen is a 64 y.o. male with a history of palpitations longer runs. At one point over 15 years ago, he had a heart rate of 160. He did not seek medical help at that time and does not know what the rhythm was. He was seen in the office for palpitations and had a Holter monitor placed.   He is here today in follow-up.  While he was wearing the Holter monitor, he had a few skips but not very many and none of the longer runs. With the longer runs, he would get a little lightheaded, but was not presyncopal and did not feel he was in danger of falling or losing consciousness.  He is aware of the single beats and definitely feels his heart skip.  He plays golf and is active around the plant walking a great deal and climbing steps without chest pain or dyspnea on exertion. He feels he is living a reasonably heart healthy life.   Past Medical History  Diagnosis Date  . Vertigo   . Hypercholesterolemia   . Obesity   . History of gallstones   . Tobacco user   . Hypertension     Past Surgical History  Procedure Laterality Date  . Cholecystectomy      Current Outpatient Prescriptions  Medication Sig Dispense Refill  . allopurinol (ZYLOPRIM) 300 MG tablet Take 300 mg by mouth daily.    Marland Kitchen aspirin 81 MG tablet Take 81 mg by mouth daily.     Marland Kitchen atorvastatin (LIPITOR) 10 MG tablet Take 1 tablet (10 mg total) by mouth daily. 90 tablet 3   No current facility-administered medications for this visit.    Allergies:   Review of patient's allergies indicates no known allergies.    Social History:  The patient  reports that he has been smoking  Cigars.  He has never used smokeless tobacco. He reports that he drinks about 6.0 - 7.2 oz of alcohol per week.   Family History:  The patient's family history is negative for Colon cancer.    ROS:  Please see the history of present illness. All other systems are reviewed and negative.    PHYSICAL EXAM: VS:  BP 136/84 mmHg  Pulse 60  Ht 5\' 10"  (1.778 m)  Wt 220 lb 6.4 oz (99.973 kg)  BMI 31.62 kg/m2 , BMI Body mass index is 31.62 kg/(m^2). GEN: Well nourished, well developed, male in no acute distress HEENT: normal Neck: no JVD, carotid bruits, or masses Cardiac: RRR; no murmurs, rubs, or gallops,no edema  Respiratory:  clear to auscultation bilaterally, normal work of breathing GI: soft, nontender, nondistended, + BS MS: no deformity or atrophy Skin: warm and dry, no rash Neuro:  Strength and sensation are intact Psych: euthymic mood, full affect   EKG:  EKG is not ordered today.  Holter monitor: The Holter monitor demonstrated sinus rhythm and sinus tachycardia. There were no extreme high or low rates. He had PVCs but no longer runs and no other arrhythmia was noted.   Recent Labs: No results found for requested labs within last 365 days.  Lipid Panel    Component Value Date/Time   CHOL 125 03/29/2011 0836   TRIG 141.0 03/29/2011 0836   HDL 33.50* 03/29/2011 0836   CHOLHDL 4 03/29/2011 0836   VLDL 28.2 03/29/2011 0836   LDLCALC 63 03/29/2011 0836     Wt Readings from Last 3 Encounters:  03/04/15 220 lb 6.4 oz (99.973 kg)  02/18/15 221 lb 12.8 oz (100.608 kg)  11/24/14 230 lb (104.327 kg)     Other studies Reviewed: Additional studies/ records that were reviewed today include: Office Notes, ECGs and Holter report is.  ASSESSMENT AND PLAN:  1.  Palpitations: Mr. Haskin is currently having only single beats and no longer runs. He does not use over-the-counter cold medicines, limits caffeine intake, and does not abuse alcohol or drugs.  Advised him that he  had already accounted for most of the external triggers that people get. Advised him that his blood pressure and heart rate well-controlled at this time and I did not feel strongly about adding a beta blocker but we could if he wanted to try to reduce the frequency of the PVCs. He refuses this.  If he has additional longer runs of palpitations, we can try a longer event monitor, or a loop recorder. He does not wish to do either of these at this time.  Current medicines are reviewed at length with the patient today.  The patient does not have concerns regarding medicines.  The following changes have been made:  no change  Labs/ tests ordered today include:  No orders of the defined types were placed in this encounter.     Disposition:   FU with Dr. Martinique when necessary  Signed, Lenoard Aden  03/04/2015 [facility] [place], [place], [place]  [address] Phone: [phone]; Fax: [phone]

## 2015-08-11 ENCOUNTER — Encounter: Payer: Self-pay | Admitting: Gastroenterology

## 2015-10-02 ENCOUNTER — Ambulatory Visit (AMBULATORY_SURGERY_CENTER): Payer: Self-pay | Admitting: *Deleted

## 2015-10-02 VITALS — Ht 70.0 in | Wt 234.5 lb

## 2015-10-02 DIAGNOSIS — Z1211 Encounter for screening for malignant neoplasm of colon: Secondary | ICD-10-CM

## 2015-10-02 NOTE — Progress Notes (Signed)
No allergies to eggs or soy. No problems with anesthesia.  Pt not given Emmi instructions for colonoscopy; pt declinced  No oxygen use  No diet drug use

## 2015-10-06 ENCOUNTER — Encounter: Payer: Self-pay | Admitting: Gastroenterology

## 2015-10-16 ENCOUNTER — Ambulatory Visit (AMBULATORY_SURGERY_CENTER): Payer: 59 | Admitting: Gastroenterology

## 2015-10-16 ENCOUNTER — Encounter: Payer: Self-pay | Admitting: Gastroenterology

## 2015-10-16 VITALS — BP 117/76 | HR 63 | Temp 98.0°F | Resp 18 | Ht 70.0 in | Wt 234.0 lb

## 2015-10-16 DIAGNOSIS — Z86010 Personal history of colonic polyps: Secondary | ICD-10-CM | POA: Diagnosis not present

## 2015-10-16 DIAGNOSIS — D124 Benign neoplasm of descending colon: Secondary | ICD-10-CM

## 2015-10-16 DIAGNOSIS — Z1211 Encounter for screening for malignant neoplasm of colon: Secondary | ICD-10-CM

## 2015-10-16 MED ORDER — SODIUM CHLORIDE 0.9 % IV SOLN: 500.0000 mL | INTRAVENOUS | Status: DC

## 2015-10-16 NOTE — Progress Notes (Signed)
Called to room to assist during endoscopic procedure.  Patient ID and intended procedure confirmed with present staff. Received instructions for my participation in the procedure from the performing physician.  

## 2015-10-16 NOTE — Progress Notes (Signed)
Report to PACU, RN, vss, BBS= Clear.  

## 2015-10-16 NOTE — Patient Instructions (Signed)
YOU HAD AN ENDOSCOPIC PROCEDURE TODAY AT THE [facility]:   Refer to the procedure report that was given to you for any specific questions about what was found during the examination.  If the procedure report does not answer your questions, please call your gastroenterologist to clarify.  If you requested that your care partner not be given the details of your procedure findings, then the procedure report has been included in a sealed envelope for you to review at your convenience later.  YOU SHOULD EXPECT: Some feelings of bloating in the abdomen. Passage of more gas than usual.  Walking can help get rid of the air that was put into your GI tract during the procedure and reduce the bloating. If you had a lower endoscopy (such as a colonoscopy or flexible sigmoidoscopy) you may notice spotting of blood in your stool or on the toilet paper. If you underwent a bowel prep for your procedure, you may not have a normal bowel movement for a few days.  Please Note:  You might notice some irritation and congestion in your nose or some drainage.  This is from the oxygen used during your procedure.  There is no need for concern and it should clear up in a day or so.  SYMPTOMS TO REPORT IMMEDIATELY:   Following lower endoscopy (colonoscopy or flexible sigmoidoscopy):  Excessive amounts of blood in the stool  Significant tenderness or worsening of abdominal pains  Swelling of the abdomen that is new, acute  Fever of 100F or higher   For urgent or emergent issues, a gastroenterologist can be reached at any hour by calling [phone].   DIET: Your first meal following the procedure should be a small meal and then it is ok to progress to your normal diet. Heavy or fried foods are harder to digest and may make you feel nauseous or bloated.  Likewise, meals heavy in dairy and vegetables can increase bloating.  Drink plenty of fluids but you should avoid alcoholic beverages for 24  hours.  ACTIVITY:  You should plan to take it easy for the rest of today and you should NOT DRIVE or use heavy machinery until tomorrow (because of the sedation medicines used during the test).    FOLLOW UP: Our staff will call the number listed on your records the next business day following your procedure to check on you and address any questions or concerns that you may have regarding the information given to you following your procedure. If we do not reach you, we will leave a message.  However, if you are feeling well and you are not experiencing any problems, there is no need to return our call.  We will assume that you have returned to your regular daily activities without incident.  If any biopsies were taken you will be contacted by phone or by letter within the next 1-3 weeks.  Please call us at [phone] if you have not heard about the biopsies in 3 weeks.    SIGNATURES/CONFIDENTIALITY: You and/or your care partner have signed paperwork which will be entered into your electronic medical record.  These signatures attest to the fact that that the information above on your After Visit Summary has been reviewed and is understood.  Full responsibility of the confidentiality of this discharge information lies with you and/or your care-partner. 

## 2015-10-16 NOTE — Op Note (Signed)
[name] Procedure Date: 10/16/2015 8:11 AM MRN: [id number] Endoscopist: Mauri Pole , MD Age: 65 Date of Birth: 1951-01-22 Gender: Male Procedure:                Colonoscopy Indications:              High risk colon cancer surveillance: Personal                            history of sessile serrated colon polyp (less than                            10 mm in size) with no dysplasia Medicines:                Monitored Anesthesia Care Procedure:                Pre-Anesthesia Assessment:                           - Prior to the procedure, a History and Physical                            was performed, and patient medications and                            allergies were reviewed. The patient's tolerance of                            previous anesthesia was also reviewed. The risks                            and benefits of the procedure and the sedation                            options and risks were discussed with the patient.                            All questions were answered, and informed consent                            was obtained. Prior Anticoagulants: The patient has                            taken no previous anticoagulant or antiplatelet                            agents. ASA Grade Assessment: II - A patient with                            mild systemic disease. After reviewing the risks                            and benefits, the patient was deemed in  satisfactory condition to undergo the procedure.                           After obtaining informed consent, the colonoscope                            was passed under direct vision. Throughout the                            procedure, the patient's blood pressure, pulse, and                            oxygen saturations were monitored continuously. The                            Model CF-HQ190L [id number]) scope was introduced               through the anus and advanced to the the cecum,                            identified by appendiceal orifice and ileocecal                            valve. The colonoscopy was performed without                            difficulty. The patient tolerated the procedure                            well. The quality of the bowel preparation was                            good. The ileocecal valve, appendiceal orifice, and                            rectum were photographed. Scope In: 8:17:43 AM Scope Out: 8:37:04 AM Scope Withdrawal Time: 0 hours 13 minutes 51 seconds  Total Procedure Duration: 0 hours 19 minutes 21 seconds  Findings:                 The perianal and digital rectal examinations were                            normal.                           A 4 mm polyp was found in the descending colon. The                            polyp was sessile. The polyp was removed with a                            cold biopsy forceps. Resection and retrieval were  complete.                           Non-bleeding internal hemorrhoids were found during                            retroflexion. The hemorrhoids were small. Complications:            No immediate complications. Estimated Blood Loss:     Estimated blood loss: none. Impression:               - One 4 mm polyp in the descending colon, removed                            with a cold biopsy forceps. Resected and retrieved.                           - Non-bleeding internal hemorrhoids. Recommendation:           - Patient has a contact number available for                            emergencies. The signs and symptoms of potential                            delayed complications were discussed with the                            patient. Return to normal activities tomorrow.                            Written discharge instructions were provided to the                            patient.                            - Resume previous diet.                           - Continue present medications.                           - Await pathology results.                           - Repeat colonoscopy in 5-10 years for surveillance. Mauri Pole, MD 10/16/2015 8:47:46 AM This report has been signed electronically.

## 2015-10-19 ENCOUNTER — Telehealth: Payer: Self-pay | Admitting: *Deleted

## 2015-10-19 NOTE — Telephone Encounter (Signed)
  Follow up Call-  Call back number 10/16/2015  Post procedure Call Back phone  # [phone]  Permission to leave phone message Yes     Patient questions:  Do you have a fever, pain , or abdominal swelling? No. Pain Score  0 *  Have you tolerated food without any problems? Yes.    Have you been able to return to your normal activities? Yes.    Do you have any questions about your discharge instructions: Diet   No. Medications  No. Follow up visit  No.  Do you have questions or concerns about your Care? No.  Actions: * If pain score is 4 or above: No action needed, pain <4.

## 2015-10-20 ENCOUNTER — Encounter: Payer: Self-pay | Admitting: Gastroenterology

## 2017-10-19 DIAGNOSIS — M9903 Segmental and somatic dysfunction of lumbar region: Secondary | ICD-10-CM | POA: Diagnosis not present

## 2017-10-19 DIAGNOSIS — M9902 Segmental and somatic dysfunction of thoracic region: Secondary | ICD-10-CM | POA: Diagnosis not present

## 2017-10-19 DIAGNOSIS — M9905 Segmental and somatic dysfunction of pelvic region: Secondary | ICD-10-CM | POA: Diagnosis not present

## 2017-10-19 DIAGNOSIS — M791 Myalgia, unspecified site: Secondary | ICD-10-CM | POA: Diagnosis not present

## 2018-01-24 DIAGNOSIS — R69 Illness, unspecified: Secondary | ICD-10-CM | POA: Diagnosis not present

## 2018-03-14 DIAGNOSIS — R69 Illness, unspecified: Secondary | ICD-10-CM | POA: Diagnosis not present

## 2018-03-19 DIAGNOSIS — M9902 Segmental and somatic dysfunction of thoracic region: Secondary | ICD-10-CM | POA: Diagnosis not present

## 2018-03-19 DIAGNOSIS — M9903 Segmental and somatic dysfunction of lumbar region: Secondary | ICD-10-CM | POA: Diagnosis not present

## 2018-03-19 DIAGNOSIS — M9905 Segmental and somatic dysfunction of pelvic region: Secondary | ICD-10-CM | POA: Diagnosis not present

## 2018-03-19 DIAGNOSIS — M791 Myalgia, unspecified site: Secondary | ICD-10-CM | POA: Diagnosis not present

## 2018-05-15 DIAGNOSIS — D225 Melanocytic nevi of trunk: Secondary | ICD-10-CM | POA: Diagnosis not present

## 2018-05-15 DIAGNOSIS — D1801 Hemangioma of skin and subcutaneous tissue: Secondary | ICD-10-CM | POA: Diagnosis not present

## 2018-05-15 DIAGNOSIS — D2371 Other benign neoplasm of skin of right lower limb, including hip: Secondary | ICD-10-CM | POA: Diagnosis not present

## 2018-05-15 DIAGNOSIS — L821 Other seborrheic keratosis: Secondary | ICD-10-CM | POA: Diagnosis not present

## 2018-05-15 DIAGNOSIS — L565 Disseminated superficial actinic porokeratosis (DSAP): Secondary | ICD-10-CM | POA: Diagnosis not present

## 2018-05-15 DIAGNOSIS — L723 Sebaceous cyst: Secondary | ICD-10-CM | POA: Diagnosis not present

## 2018-05-15 DIAGNOSIS — L812 Freckles: Secondary | ICD-10-CM | POA: Diagnosis not present

## 2018-06-15 DIAGNOSIS — M9902 Segmental and somatic dysfunction of thoracic region: Secondary | ICD-10-CM | POA: Diagnosis not present

## 2018-06-15 DIAGNOSIS — M9905 Segmental and somatic dysfunction of pelvic region: Secondary | ICD-10-CM | POA: Diagnosis not present

## 2018-06-15 DIAGNOSIS — M791 Myalgia, unspecified site: Secondary | ICD-10-CM | POA: Diagnosis not present

## 2018-06-15 DIAGNOSIS — M9903 Segmental and somatic dysfunction of lumbar region: Secondary | ICD-10-CM | POA: Diagnosis not present

## 2018-07-20 DIAGNOSIS — E7849 Other hyperlipidemia: Secondary | ICD-10-CM | POA: Diagnosis not present

## 2018-07-20 DIAGNOSIS — I10 Essential (primary) hypertension: Secondary | ICD-10-CM | POA: Diagnosis not present

## 2018-07-20 DIAGNOSIS — R7301 Impaired fasting glucose: Secondary | ICD-10-CM | POA: Diagnosis not present

## 2018-07-20 DIAGNOSIS — M109 Gout, unspecified: Secondary | ICD-10-CM | POA: Diagnosis not present

## 2018-07-20 DIAGNOSIS — R82998 Other abnormal findings in urine: Secondary | ICD-10-CM | POA: Diagnosis not present

## 2018-07-20 DIAGNOSIS — Z125 Encounter for screening for malignant neoplasm of prostate: Secondary | ICD-10-CM | POA: Diagnosis not present

## 2018-07-23 DIAGNOSIS — R7301 Impaired fasting glucose: Secondary | ICD-10-CM | POA: Diagnosis not present

## 2018-07-23 DIAGNOSIS — Z7689 Persons encountering health services in other specified circumstances: Secondary | ICD-10-CM | POA: Diagnosis not present

## 2018-07-23 DIAGNOSIS — R945 Abnormal results of liver function studies: Secondary | ICD-10-CM | POA: Diagnosis not present

## 2018-07-23 DIAGNOSIS — E785 Hyperlipidemia, unspecified: Secondary | ICD-10-CM | POA: Diagnosis not present

## 2018-07-23 DIAGNOSIS — Z23 Encounter for immunization: Secondary | ICD-10-CM | POA: Diagnosis not present

## 2018-07-23 DIAGNOSIS — D7589 Other specified diseases of blood and blood-forming organs: Secondary | ICD-10-CM | POA: Diagnosis not present

## 2018-07-23 DIAGNOSIS — D126 Benign neoplasm of colon, unspecified: Secondary | ICD-10-CM | POA: Diagnosis not present

## 2018-07-23 DIAGNOSIS — E7849 Other hyperlipidemia: Secondary | ICD-10-CM | POA: Diagnosis not present

## 2018-07-23 DIAGNOSIS — I10 Essential (primary) hypertension: Secondary | ICD-10-CM | POA: Diagnosis not present

## 2018-07-23 DIAGNOSIS — E668 Other obesity: Secondary | ICD-10-CM | POA: Diagnosis not present

## 2018-07-23 DIAGNOSIS — R1011 Right upper quadrant pain: Secondary | ICD-10-CM | POA: Diagnosis not present

## 2018-07-23 DIAGNOSIS — Z0000 Encounter for general adult medical examination without abnormal findings: Secondary | ICD-10-CM | POA: Diagnosis not present

## 2018-07-23 DIAGNOSIS — M109 Gout, unspecified: Secondary | ICD-10-CM | POA: Diagnosis not present

## 2018-07-23 DIAGNOSIS — M545 Low back pain: Secondary | ICD-10-CM | POA: Diagnosis not present

## 2018-07-25 ENCOUNTER — Other Ambulatory Visit: Payer: Self-pay | Admitting: Internal Medicine

## 2018-07-25 DIAGNOSIS — R109 Unspecified abdominal pain: Secondary | ICD-10-CM

## 2018-07-27 DIAGNOSIS — Z1212 Encounter for screening for malignant neoplasm of rectum: Secondary | ICD-10-CM | POA: Diagnosis not present

## 2018-08-06 ENCOUNTER — Other Ambulatory Visit: Payer: 59

## 2018-08-06 ENCOUNTER — Ambulatory Visit
Admission: RE | Admit: 2018-08-06 | Discharge: 2018-08-06 | Disposition: A | Discharge status: Home / Self Care | Payer: 59 | Source: Ambulatory Visit | Attending: Internal Medicine | Admitting: Internal Medicine

## 2018-08-06 DIAGNOSIS — K4090 Unilateral inguinal hernia, without obstruction or gangrene, not specified as recurrent: Secondary | ICD-10-CM | POA: Diagnosis not present

## 2018-08-06 DIAGNOSIS — R109 Unspecified abdominal pain: Secondary | ICD-10-CM

## 2018-08-06 MED ORDER — IOPAMIDOL (ISOVUE-300) INJECTION 61%
125.0000 mL | Freq: Once | INTRAVENOUS | Status: AC | PRN
  Administered 2018-08-06: 125 mL via INTRAVENOUS

## 2019-01-11 DIAGNOSIS — I10 Essential (primary) hypertension: Secondary | ICD-10-CM | POA: Diagnosis not present

## 2019-01-11 DIAGNOSIS — Z7689 Persons encountering health services in other specified circumstances: Secondary | ICD-10-CM | POA: Diagnosis not present

## 2019-01-11 DIAGNOSIS — R002 Palpitations: Secondary | ICD-10-CM | POA: Diagnosis not present

## 2019-01-22 NOTE — Progress Notes (Signed)
Cardiology Office Note   Date:  01/23/2019   ID:  Jim Nguyen, DOB [birth year], MRN [id number]  PCP:  Jim Infante, MD  Cardiologist:   Jim Martinique, MD   Chief Complaint  Patient presents with  . Palpitations      History of Present Illness: Jim Nguyen is a 68 y.o. male is seen at the request of Jim Nguyen for evaluation of palpitations.  He has a history of HTN and HLD. He was last seen in 2016 with complaints of palpitations. Holter monitor showed PACs.   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 He notes on occasion a sense that his heart is beating hard or occasional skip beat. HR typically is 60 but may go up to 80s. No tachycardia, dizziness, chest pain or SOB. Sometimes is triggered by too much wine. One episode occurred when he had not slept well and then played golf in the heat. He does play a lot of golf and walks.     Past Medical History:  Diagnosis Date  . Arthritis   . Gout   . History of gallstones   . Hypercholesterolemia   . Hypertension   . Obesity   . Tobacco user   . Vertigo     Past Surgical History:  Procedure Laterality Date  . CHOLECYSTECTOMY    . COLONOSCOPY  2007     Current Outpatient Medications  Medication Sig Dispense Refill  . allopurinol (ZYLOPRIM) 300 MG tablet Take 300 mg by mouth daily.    Marland Kitchen atorvastatin (LIPITOR) 10 MG tablet Take 1 tablet (10 mg total) by mouth daily. 90 tablet 3  . telmisartan (MICARDIS) 20 MG tablet Take 20 mg by mouth daily.    Marland Kitchen aspirin 81 MG tablet Take 81 mg by mouth daily.     Marland Kitchen doxycycline (VIBRAMYCIN) 100 MG capsule   0  . PAZEO 0.7 % SOLN INT 1 GTT IN OU D  4   No current facility-administered medications for this visit.     Allergies:   Patient has no known allergies.    Social History:  The patient  reports that he quit smoking about 40 years ago. His smoking use included cigars. He smoked 0.50 packs per day. He has never used smokeless tobacco. He reports current alcohol use of  about 10.0 - 12.0 standard drinks of alcohol per week. He reports that he does not use drugs.   Family History:  The patient's family history includes Heart disease in his father; Pancreatic cancer in his father.    ROS:  Please see the history of present illness.   Otherwise, review of systems are positive for none.   All other systems are reviewed and negative.    PHYSICAL EXAM: VS:  BP 132/70   Pulse 72   Temp 97.6 F (36.4 C)   Ht 5\' 10"  (1.778 m)   Wt 220 lb (99.8 kg)   SpO2 96%   BMI 31.57 kg/m  , BMI Body mass index is 31.57 kg/m. GEN: Well nourished, overweight, in no acute distress  HEENT: normal  Neck: no JVD, carotid bruits, or masses Cardiac: RRR; no murmurs, rubs, or gallops,no edema  Respiratory:  clear to auscultation bilaterally, normal work of breathing GI: soft, nontender, nondistended, + BS MS: no deformity or atrophy  Skin: warm and dry, no rash Neuro:  Strength and sensation are intact Psych: euthymic mood, full affect   EKG:  EKG is ordered today. The  ekg ordered today demonstrates NSR with nonspecific TWA in lateral leads. I have personally reviewed and interpreted this study.    Recent Labs: No results found for requested labs within last 8760 hours.    Lipid Panel    Component Value Date/Time   CHOL 125 03/29/2011 0836   TRIG 141.0 03/29/2011 0836   HDL 33.50 (L) 03/29/2011 0836   CHOLHDL 4 03/29/2011 0836   VLDL 28.2 03/29/2011 0836   LDLCALC 63 03/29/2011 0836    Labs dated 07/20/18: cholesterol 107, triglycerides 112, HDL 34, LDL 51. A1c 5.4%.  Dated 01/11/19: CBC, CMET, TSH normal   Wt Readings from Last 3 Encounters:  01/23/19 220 lb (99.8 kg)  10/16/15 234 lb (106.1 kg)  10/02/15 234 lb 8 oz (106.4 kg)      Other studies Reviewed: Additional studies/ records that were reviewed today include: see above Review of the above records demonstrates: N/A   ASSESSMENT AND PLAN:  1.  Palpitations. History is consistent with PACs  which were noted in 2016. Nothing to suggest more serious arrtyhmia. Possible triggers include excessive Etoh, dehydration, lack of sleep. At this point would not pursue any further evaluation. Follow up prn. 2. HTN well controlled 3. Hypercholesterolemia. Well controlled.   Current medicines are reviewed at length with the patient today.  The patient does not have concerns regarding medicines.  The following changes have been made:  no change  Labs/ tests ordered today include:  No orders of the defined types were placed in this encounter.    Disposition:   FU PRN  Signed, Jim Martinique, MD  01/23/2019 10:54 AM    [facility] [address], [place], [place], [address] Phone [phone], Fax [phone]

## 2019-01-23 ENCOUNTER — Other Ambulatory Visit: Payer: Self-pay

## 2019-01-23 ENCOUNTER — Ambulatory Visit (INDEPENDENT_AMBULATORY_CARE_PROVIDER_SITE_OTHER): Payer: Medicare HMO | Admitting: Cardiology

## 2019-01-23 ENCOUNTER — Encounter: Payer: Self-pay | Admitting: Cardiology

## 2019-01-23 VITALS — BP 132/70 | HR 72 | Temp 97.6°F | Ht 70.0 in | Wt 220.0 lb

## 2019-01-23 DIAGNOSIS — I10 Essential (primary) hypertension: Secondary | ICD-10-CM | POA: Diagnosis not present

## 2019-01-23 DIAGNOSIS — R002 Palpitations: Secondary | ICD-10-CM | POA: Diagnosis not present

## 2019-01-23 DIAGNOSIS — E7800 Pure hypercholesterolemia, unspecified: Secondary | ICD-10-CM

## 2019-03-19 DIAGNOSIS — R69 Illness, unspecified: Secondary | ICD-10-CM | POA: Diagnosis not present

## 2019-04-03 ENCOUNTER — Ambulatory Visit: Payer: Medicare HMO | Admitting: Cardiology

## 2019-04-03 ENCOUNTER — Other Ambulatory Visit: Payer: Self-pay

## 2019-04-03 ENCOUNTER — Encounter: Payer: Self-pay | Admitting: Cardiology

## 2019-04-03 VITALS — BP 109/70 | HR 84 | Ht 70.0 in | Wt 228.0 lb

## 2019-04-03 DIAGNOSIS — E7800 Pure hypercholesterolemia, unspecified: Secondary | ICD-10-CM

## 2019-04-03 DIAGNOSIS — R002 Palpitations: Secondary | ICD-10-CM

## 2019-04-03 DIAGNOSIS — I10 Essential (primary) hypertension: Secondary | ICD-10-CM

## 2019-04-03 NOTE — Progress Notes (Signed)
Cardiology Office Note:    Date:  04/03/2019   ID:  Jim Nguyen, DOB 1950-06-08, MRN [id number]  PCP:  Crist Infante, MD  Cardiologist:  Peter Martinique, MD  Referring MD: Crist Infante, MD   CC: urgent DOD visit for palpitations  History of Present Illness:    Jim Nguyen is a 68 y.o. male with a hx of intermittent palpitations, hypertension, gout, hyperlipidemia who is seen as an urgent DOD visit today for palpitations.  Last night, hardly slept at all. Feels like his heart was racing the entire night. No chest pain/pressure per se, but felt like hard beats in his central chest up to his neck. His pulse felt unsteady, fast.   He has had intermittent palpitations for years, but feels that it is becoming more frequent. Doesn't happen every day/night. Happening 2-3 times/week. Lasts 20-30 minutes usually, but like last night can last all night.   He drinks wine, 2 glasses 5-6 nights/week. No smoking in 4 years. Doesn't automatically happen when he drinks but sometimes he notes this. No other clear triggers that he can think of.   Has not worn monitor since 2016. Active at baseline, plays golf several times/week. Stays hydrated. Drank about 50 oz of water this AM to try to help his palpitations. HR in the AM usually 60s. Has BP cuff at home, BP has been consistently well controlled. This AM, 120/70, HR 83.  Doesn't drink coffee, drinks unsweetened black tea. Otherwise no stimulants.  Denies chest pain, shortness of breath at rest or with normal exertion. No PND, orthopnea, LE edema. No syncope.  Past Medical History:  Diagnosis Date  . Arthritis   . Gout   . History of gallstones   . Hypercholesterolemia   . Hypertension   . Obesity   . Tobacco user   . Vertigo     Past Surgical History:  Procedure Laterality Date  . CHOLECYSTECTOMY    . COLONOSCOPY  2007    Current Medications: Current Outpatient Medications on File Prior to Visit  Medication Sig  . allopurinol  (ZYLOPRIM) 300 MG tablet Take 300 mg by mouth daily.  Marland Kitchen atorvastatin (LIPITOR) 20 MG tablet Take 20 mg by mouth daily.  Marland Kitchen telmisartan (MICARDIS) 20 MG tablet Take 20 mg by mouth daily.   No current facility-administered medications on file prior to visit.      Allergies:   Patient has no known allergies.   Social History   Tobacco Use  . Smoking status: Former Smoker    Packs/day: 0.50    Types: Cigars    Quit date: 01/23/1979    Years since quitting: 40.2  . Smokeless tobacco: Never Used  Substance Use Topics  . Alcohol use: Yes    Alcohol/week: 10.0 - 12.0 standard drinks    Types: 10 - 12 Glasses of wine per week  . Drug use: No    Family History: family history includes Heart disease in his father; Pancreatic cancer in his father. There is no history of Colon cancer.  ROS:   Please see the history of present illness.  Additional pertinent ROS: Constitutional: Negative for chills, fever, night sweats, unintentional weight loss  HENT: Negative for ear pain and hearing loss.   Eyes: Negative for loss of vision and eye pain.  Respiratory: Negative for cough, sputum, wheezing.   Cardiovascular: See HPI. Gastrointestinal: Negative for abdominal pain, melena, and hematochezia.  Genitourinary: Negative for dysuria and hematuria.  Musculoskeletal: Negative for falls and myalgias.  Skin: Negative for itching and rash.  Neurological: Negative for focal weakness, focal sensory changes and loss of consciousness.  Endo/Heme/Allergies: Does not bruise/bleed easily.     EKGs/Labs/Other Studies Reviewed:    The following studies were reviewed today: Prior holter monitor  EKG:  EKG is personally reviewed.  The ekg ordered today demonstrates NSR at 73 bpm.  Recent Labs: No results found for requested labs within last 8760 hours.  Recent Lipid Panel    Component Value Date/Time   CHOL 125 03/29/2011 0836   TRIG 141.0 03/29/2011 0836   HDL 33.50 (L) 03/29/2011 0836   CHOLHDL 4  03/29/2011 0836   VLDL 28.2 03/29/2011 0836   LDLCALC 63 03/29/2011 0836    Physical Exam:    VS:  BP 109/70   Pulse 84   Ht 5\' 10"  (1.778 m)   Wt 228 lb (103.4 kg)   SpO2 97%   BMI 32.71 kg/m     Wt Readings from Last 3 Encounters:  04/03/19 228 lb (103.4 kg)  01/23/19 220 lb (99.8 kg)  10/16/15 234 lb (106.1 kg)    GEN: Well nourished, well developed in no acute distress HEENT: Normal, moist mucous membranes NECK: No JVD CARDIAC: regular rhythm, normal S1 and S2, no rubs or gallops. No murmurs. VASCULAR: Radial and DP pulses 2+ bilaterally. No carotid bruits RESPIRATORY:  Clear to auscultation without rales, wheezing or rhonchi  ABDOMEN: Soft, non-tender, non-distended MUSCULOSKELETAL:  Ambulates independently SKIN: Warm and dry, no edema NEUROLOGIC:  Alert and oriented x 3. No focal neuro deficits noted. PSYCHIATRIC:  Normal affect    ASSESSMENT:    1. Palpitations   2. Essential hypertension   3. Hypercholesteremia    PLAN:    Palpitations: discussed at length today.  -check electrolytes, BMET/Mg -ECG NSR -has never had echo. Will order this to rule out structural abnormalities as cause -discussed monitors vs. [employer]ia[employer]. I recommend 2 week Zio, he is amenable. Instructed on use. -instructed on red flag warning signs that need immediate medical attention.  Hypertension: controlled today, no changes Hypercholesterolemia: last LDL 52 07/2018.  Plan for follow up: 6 weeks with Dr. Martinique or APP  Medication Adjustments/Labs and Tests Ordered: Current medicines are reviewed at length with the patient today.  Concerns regarding medicines are outlined above.  Orders Placed This Encounter  Procedures  . Basic metabolic panel  . Magnesium  . CARDIAC EVENT MONITOR  . EKG 12-Lead  . ECHOCARDIOGRAM COMPLETE   No orders of the defined types were placed in this encounter.   Patient Instructions  Medication Instructions:  Your physician recommends that you  continue on your current medications as directed. Please refer to the Current Medication list given to you today.  If you need a refill on your cardiac medications before your next appointment, please call your pharmacy.   Lab work: BMET, Mag If you have labs (blood work) drawn today and your tests are completely normal, you will receive your results only by: [place] (if you have MyChart) OR A paper copy in the mail If you have any lab test that is abnormal or we need to change your treatment, we will call you to review the results.  Testing/Procedures: Your physician has requested that you have an echocardiogram. Echocardiography is a painless test that uses sound waves to create images of your heart. It provides your doctor with information about the size and shape of your heart and how well your heart's chambers and valves are working.  This procedure takes approximately one hour. There are no restrictions for this procedure. [place] has recommended that you wear a 14 day event monitor. Event monitors are medical devices that record the heart's electrical activity. Doctors most often [place] these monitors to diagnose arrhythmias. Arrhythmias are problems with the speed or rhythm of the heartbeat. The monitor is a small, portable device. You can wear one while you do your normal daily activities. This is usually used to diagnose what is causing palpitations/syncope (passing out).    Follow-Up: At [facility], you and your health needs are our priority.  As part of our continuing mission to provide you with exceptional heart care, we have created designated Provider Care Teams.  These Care Teams include your primary Cardiologist (physician) and Advanced Practice Providers (APPs -  Physician Assistants and Nurse Practitioners) who all work together to provide you with the care you need, when you need it. You may see Dr. Martinique or one of the following  Advanced Practice Providers on your designated Care Team:    Almyra Deforest, PA-C  Fabian Sharp, [place] or   Roby Lofts, [place] Your physician wants you to follow-up in: 6 weeks. You will receive a reminder letter in the mail two months in advance. If you don't receive a letter, please call our office to schedule the follow-up appointment.    Signed, Buford Dresser, MD PhD 04/03/2019 1:32 PM    [facility]

## 2019-04-03 NOTE — Patient Instructions (Signed)
Medication Instructions:  Your physician recommends that you continue on your current medications as directed. Please refer to the Current Medication list given to you today.  If you need a refill on your cardiac medications before your next appointment, please call your pharmacy.   Lab work: BMET, Mag If you have labs (blood work) drawn today and your tests are completely normal, you will receive your results only by: [place] (if you have MyChart) OR A paper copy in the mail If you have any lab test that is abnormal or we need to change your treatment, we will call you to review the results.  Testing/Procedures: Your physician has requested that you have an echocardiogram. Echocardiography is a painless test that uses sound waves to create images of your heart. It provides your doctor with information about the size and shape of your heart and how well your heart's chambers and valves are working. This procedure takes approximately one hour. There are no restrictions for this procedure. [place] has recommended that you wear a 14 day event monitor. Event monitors are medical devices that record the heart's electrical activity. Doctors most often [place] these monitors to diagnose arrhythmias. Arrhythmias are problems with the speed or rhythm of the heartbeat. The monitor is a small, portable device. You can wear one while you do your normal daily activities. This is usually used to diagnose what is causing palpitations/syncope (passing out).    Follow-Up: At [facility], you and your health needs are our priority.  As part of our continuing mission to provide you with exceptional heart care, we have created designated Provider Care Teams.  These Care Teams include your primary Cardiologist (physician) and Advanced Practice Providers (APPs -  Physician Assistants and Nurse Practitioners) who all work together to provide you with the care you need,  when you need it. You may see Dr. Martinique or one of the following Advanced Practice Providers on your designated Care Team:    Almyra Deforest, PA-C  Fabian Sharp, [place] or   Roby Lofts, [place] Your physician wants you to follow-up in: 6 weeks. You will receive a reminder letter in the mail two months in advance. If you don't receive a letter, please call our office to schedule the follow-up appointment.

## 2019-04-04 LAB — BASIC METABOLIC PANEL
BUN/Creatinine Ratio: 17 (ref 10–24)
BUN: 15 mg/dL (ref 8–27)
CO2: 24 mmol/L (ref 20–29)
Calcium: 9.5 mg/dL (ref 8.6–10.2)
Chloride: 99 mmol/L (ref 96–106)
Creatinine, Ser: 0.87 mg/dL (ref 0.76–1.27)
GFR calc Af Amer: 104 mL/min/{1.73_m2} (ref >59)
GFR calc non Af Amer: 90 mL/min/{1.73_m2} (ref >59)
Glucose: 106 mg/dL — ABNORMAL HIGH (ref 65–99)
Potassium: 4.4 mmol/L (ref 3.5–5.2)
Sodium: 138 mmol/L (ref 134–144)

## 2019-04-04 LAB — MAGNESIUM: Magnesium: 2.2 mg/dL (ref 1.6–2.3)

## 2019-04-15 ENCOUNTER — Telehealth: Payer: Self-pay

## 2019-04-15 ENCOUNTER — Other Ambulatory Visit: Payer: Self-pay

## 2019-04-15 ENCOUNTER — Ambulatory Visit (HOSPITAL_COMMUNITY): Payer: Medicare HMO | Attending: Internal Medicine

## 2019-04-15 DIAGNOSIS — I10 Essential (primary) hypertension: Secondary | ICD-10-CM | POA: Diagnosis not present

## 2019-04-15 DIAGNOSIS — R002 Palpitations: Secondary | ICD-10-CM | POA: Diagnosis not present

## 2019-04-15 NOTE — Telephone Encounter (Signed)
Spoke to pt, went over monitor instructions. Verified address. 14 day ZIO ordered.  

## 2019-04-19 ENCOUNTER — Other Ambulatory Visit: Payer: Self-pay

## 2019-04-19 ENCOUNTER — Ambulatory Visit (INDEPENDENT_AMBULATORY_CARE_PROVIDER_SITE_OTHER): Payer: Medicare HMO

## 2019-04-19 DIAGNOSIS — Z20822 Contact with and (suspected) exposure to covid-19: Secondary | ICD-10-CM

## 2019-04-19 DIAGNOSIS — I10 Essential (primary) hypertension: Secondary | ICD-10-CM | POA: Diagnosis not present

## 2019-04-19 DIAGNOSIS — R002 Palpitations: Secondary | ICD-10-CM | POA: Diagnosis not present

## 2019-04-22 LAB — NOVEL CORONAVIRUS, NAA: SARS-CoV-2, NAA: NOT DETECTED

## 2019-05-07 DIAGNOSIS — I480 Paroxysmal atrial fibrillation: Secondary | ICD-10-CM

## 2019-05-07 HISTORY — DX: Paroxysmal atrial fibrillation: I48.0

## 2019-05-08 DIAGNOSIS — R002 Palpitations: Secondary | ICD-10-CM | POA: Diagnosis not present

## 2019-05-09 ENCOUNTER — Other Ambulatory Visit: Payer: Self-pay | Admitting: Cardiology

## 2019-05-09 DIAGNOSIS — R002 Palpitations: Secondary | ICD-10-CM

## 2019-05-09 DIAGNOSIS — I10 Essential (primary) hypertension: Secondary | ICD-10-CM

## 2019-05-10 ENCOUNTER — Telehealth: Payer: Self-pay | Admitting: Cardiology

## 2019-05-10 NOTE — Telephone Encounter (Signed)
The patient was calling in for his monitor results. He has been advised that once they are read, Dr. Judeth Cornfield nurse will call him with the results. He verbalized his understanding.

## 2019-05-10 NOTE — Telephone Encounter (Signed)
New Message    Pt is calling for results of his monitor   Please call back

## 2019-05-15 ENCOUNTER — Telehealth: Payer: Self-pay | Admitting: Cardiology

## 2019-05-15 NOTE — Telephone Encounter (Signed)
New message   Patient is calling to get monitor results. Please call.

## 2019-05-16 DIAGNOSIS — D225 Melanocytic nevi of trunk: Secondary | ICD-10-CM | POA: Diagnosis not present

## 2019-05-16 DIAGNOSIS — L814 Other melanin hyperpigmentation: Secondary | ICD-10-CM | POA: Diagnosis not present

## 2019-05-16 DIAGNOSIS — L821 Other seborrheic keratosis: Secondary | ICD-10-CM | POA: Diagnosis not present

## 2019-05-16 DIAGNOSIS — L858 Other specified epidermal thickening: Secondary | ICD-10-CM | POA: Diagnosis not present

## 2019-05-16 DIAGNOSIS — D2272 Melanocytic nevi of left lower limb, including hip: Secondary | ICD-10-CM | POA: Diagnosis not present

## 2019-05-16 DIAGNOSIS — L308 Other specified dermatitis: Secondary | ICD-10-CM | POA: Diagnosis not present

## 2019-05-16 DIAGNOSIS — D2271 Melanocytic nevi of right lower limb, including hip: Secondary | ICD-10-CM | POA: Diagnosis not present

## 2019-05-16 NOTE — Telephone Encounter (Signed)
Dr. Martinique read the monitor, and his comments are below:  NSR  Repetitive runs of PAT longest lasting 1 minute and 13 seconds  Patient triggered events correlate with PAT  We can talk about paroxysmal atrial tachycardia and management of this at his follow up next week. It is not life threatening. Thanks.

## 2019-05-17 NOTE — Telephone Encounter (Signed)
Pt updated and verbalized understanding.  

## 2019-05-20 ENCOUNTER — Encounter: Payer: Self-pay | Admitting: Cardiology

## 2019-05-20 ENCOUNTER — Ambulatory Visit: Payer: Medicare HMO | Admitting: Cardiology

## 2019-05-20 ENCOUNTER — Other Ambulatory Visit: Payer: Self-pay

## 2019-05-20 VITALS — BP 120/68 | HR 70 | Ht 70.0 in | Wt 217.0 lb

## 2019-05-20 DIAGNOSIS — I471 Supraventricular tachycardia: Secondary | ICD-10-CM

## 2019-05-20 DIAGNOSIS — Z712 Person consulting for explanation of examination or test findings: Secondary | ICD-10-CM

## 2019-05-20 DIAGNOSIS — R002 Palpitations: Secondary | ICD-10-CM | POA: Diagnosis not present

## 2019-05-20 NOTE — Progress Notes (Signed)
Cardiology Office Note:    Date:  05/20/2019   ID:  Jim Nguyen, DOB Mar 25, 1951, MRN [id number]  PCP:  Crist Infante, MD  Cardiologist:  Peter Martinique, MD  Referring MD: Crist Infante, MD   CC: follow up for monitor results  History of Present Illness:    Jim Nguyen is a 68 y.o. male with a hx of intermittent palpitations, hypertension, gout, hyperlipidemia who is seen for follow up today. He follows with Dr. Martinique, but I initially saw him as an urgent DOD visit for palpitations.  Today: Reviewed monitor results today. Has brief but frequent episodes of paroxysmal atrial tachycardia. Longest captured event was very brief, but he endorses that sometime symptoms can last hours or more--sometimes still going the next morning when he wakes up. He wonders if this is worsened by his eating habits, as he eats late and he begins to have symptoms after. Has not clearly been able to link to his alcohol intake.   We together looked at the images from his monitor. His symptoms lined up well with episodes of PAT. He doesn't have individual prolonged episodes but there are days (especially in the evenings) that he has episodes near to each other.   We also reviewed the results of his echo, discussed together.   He has been trying to eat less at dinner, has noticed fewer symptoms. Stopped taking prilosec, does have long term GERD, feels it is well controlled with his intermittent regimen of antacid medication.   Denies chest pain, shortness of breath at rest or with normal exertion. No PND, orthopnea, LE edema or unexpected weight gain. No syncope.  Past Medical History:  Diagnosis Date  . Arthritis   . Gout   . History of gallstones   . Hypercholesterolemia   . Hypertension   . Obesity   . Tobacco user   . Vertigo     Past Surgical History:  Procedure Laterality Date  . CHOLECYSTECTOMY    . COLONOSCOPY  2007    Current Medications: Current Outpatient Medications on File Prior  to Visit  Medication Sig  . allopurinol (ZYLOPRIM) 300 MG tablet Take 300 mg by mouth daily.  Marland Kitchen atorvastatin (LIPITOR) 20 MG tablet Take 20 mg by mouth daily.  Marland Kitchen telmisartan (MICARDIS) 20 MG tablet Take 20 mg by mouth daily.   No current facility-administered medications on file prior to visit.     Allergies:   Patient has no known allergies.   Social History   Tobacco Use  . Smoking status: Former Smoker    Packs/day: 0.50    Types: Cigars    Quit date: 01/23/1979    Years since quitting: 40.3  . Smokeless tobacco: Never Used  Substance Use Topics  . Alcohol use: Yes    Alcohol/week: 10.0 - 12.0 standard drinks    Types: 10 - 12 Glasses of wine per week  . Drug use: No    Family History: family history includes Heart disease in his father; Pancreatic cancer in his father. There is no history of Colon cancer.  ROS:   Please see the history of present illness.  Additional pertinent ROS: Constitutional: Negative for chills, fever, night sweats, unintentional weight loss  HENT: Negative for ear pain and hearing loss.   Eyes: Negative for loss of vision and eye pain.  Respiratory: Negative for cough, sputum, wheezing.   Cardiovascular: See HPI. Gastrointestinal: Negative for abdominal pain, melena, and hematochezia.  Genitourinary: Negative for dysuria and hematuria.  Musculoskeletal:  Negative for falls and myalgias.  Skin: Negative for itching and rash.  Neurological: Negative for focal weakness, focal sensory changes and loss of consciousness.  Endo/Heme/Allergies: Does not bruise/bleed easily.   EKGs/Labs/Other Studies Reviewed:    The following studies were reviewed today: Monitor 05/09/19  NSR  Repetitive runs of PAT longest lasting 1 minute and 13 seconds  Patient triggered events correlate with PAT  Echo 04/15/19  1. Left ventricular ejection fraction, by visual estimation, is 65 to 70%. The left ventricle has hyperdynamic function. There is no left ventricular  hypertrophy.  2. Left ventricular diastolic parameters are consistent with Grade I diastolic dysfunction (impaired relaxation).  3. Global right ventricle has normal systolic function.The right ventricular size is normal. No increase in right ventricular wall thickness.  4. Left atrial size was mildly dilated.  5. Right atrial size was normal.  6. Mild mitral annular calcification.  7. The mitral valve is abnormal. Trace mitral valve regurgitation.  8. The tricuspid valve is normal in structure. Tricuspid valve regurgitation is mild.  9. The aortic valve is tricuspid. Aortic valve regurgitation is not visualized. Mild aortic valve sclerosis without stenosis. 10. The pulmonic valve was normal in structure. Pulmonic valve regurgitation is trivial.  EKG:  EKG is personally reviewed.  The ekg ordered 04/04/19 demonstrates NSR at 73 bpm.  Recent Labs: 04/03/2019: BUN 15; Creatinine, Ser 0.87; Magnesium 2.2; Potassium 4.4; Sodium 138  Recent Lipid Panel    Component Value Date/Time   CHOL 125 03/29/2011 0836   TRIG 141.0 03/29/2011 0836   HDL 33.50 (L) 03/29/2011 0836   CHOLHDL 4 03/29/2011 0836   VLDL 28.2 03/29/2011 0836   LDLCALC 63 03/29/2011 0836    Physical Exam:    VS:  BP 120/68   Pulse 70   Ht 5\' 10"  (1.778 m)   Wt 217 lb (98.4 kg)   SpO2 98%   BMI 31.14 kg/m     Wt Readings from Last 3 Encounters:  05/20/19 217 lb (98.4 kg)  04/03/19 228 lb (103.4 kg)  01/23/19 220 lb (99.8 kg)    GEN: Well nourished, well developed in no acute distress HEENT: Normal, moist mucous membranes NECK: No JVD CARDIAC: regular rhythm, normal S1 and S2, no rubs or gallops. No murmur. VASCULAR: Radial and DP pulses 2+ bilaterally. No carotid bruits RESPIRATORY:  Clear to auscultation without rales, wheezing or rhonchi  ABDOMEN: Soft, non-tender, non-distended MUSCULOSKELETAL:  Ambulates independently SKIN: Warm and dry, no edema NEUROLOGIC:  Alert and oriented x 3. No focal neuro deficits  noted. PSYCHIATRIC:  Normal affect   ASSESSMENT:    1. Atrial tachycardia, paroxysmal ([place])   2. Encounter to discuss test results   3. Palpitations    PLAN:    Palpitations: reviewed monitor at length today. Showed him the actual strips from his monitor. Frequent paroxysmal atrial tachycardia seen.  -we discussed this and options for management at this time.  -he is feeling much better with his lifestyle changes. He wishes to monitor at this time -instructed on red flag warning signs that need immediate medical attention.  Hypertension: controlled today, no changes Hypercholesterolemia: last LDL 52 07/2018.  Plan for follow up: 3 mos with Dr. Martinique  TIME SPENT WITH PATIENT: 25 minutes of direct patient care. More than 50% of that time was spent on coordination of care and counseling regarding test results, options for management.  Buford Dresser, MD, PhD [place]  [facility]   Medication Adjustments/Labs and Tests Ordered: Current  medicines are reviewed at length with the patient today.  Concerns regarding medicines are outlined above.  No orders of the defined types were placed in this encounter.  No orders of the defined types were placed in this encounter.   Patient Instructions  Medication Instructions: No changes *If you need a refill on your cardiac medications before your next appointment, please call your pharmacy*  Lab Work: None  Testing/Procedures: None  Follow-Up: At [facility], you and your health needs are our priority.  As part of our continuing mission to provide you with exceptional heart care, we have created designated Provider Care Teams.  These Care Teams include your primary Cardiologist (physician) and Advanced Practice Providers (APPs -  Physician Assistants and Nurse Practitioners) who all work together to provide you with the care you need, when you need it.  Your next appointment:   3 month(s)  The format for your next  appointment:   In Person  Provider:   Peter Martinique, MD      Signed, Buford Dresser, MD PhD 05/20/2019 2:51 PM    [place]

## 2019-05-20 NOTE — Patient Instructions (Signed)
Medication Instructions: No changes *If you need a refill on your cardiac medications before your next appointment, please call your pharmacy*  Lab Work: None  Testing/Procedures: None  Follow-Up: At [facility], you and your health needs are our priority.  As part of our continuing mission to provide you with exceptional heart care, we have created designated Provider Care Teams.  These Care Teams include your primary Cardiologist (physician) and Advanced Practice Providers (APPs -  Physician Assistants and Nurse Practitioners) who all work together to provide you with the care you need, when you need it.  Your next appointment:   3 month(s)  The format for your next appointment:   In Person  Provider:   Peter Martinique, MD

## 2019-05-22 ENCOUNTER — Encounter: Payer: Self-pay | Admitting: Cardiology

## 2019-06-10 ENCOUNTER — Telehealth: Payer: Self-pay | Admitting: Cardiology

## 2019-06-10 NOTE — Telephone Encounter (Signed)
Patient c/o Palpitations:  High priority if patient c/o lightheadedness, shortness of breath, or chest pain  1) How long have you had palpitations/irregular HR/ Afib? Are you having the symptoms now? On and off all day, patient is having palpitations right now  2) Are you currently experiencing lightheadedness, SOB or CP? no  3) Do you have a history of afib (atrial fibrillation) or irregular heart rhythm? Off and on over the years  4) Have you checked your BP or HR? (document readings if available):   Hr: 77  5) Are you experiencing any other symptoms?  no

## 2019-06-10 NOTE — Telephone Encounter (Signed)
Spoke with patient of Dr. Martinique, who most recently saw Dr. Harrell Gave. Patient has PAT, per monitor. He reports continued palpitations. He does not have shortness of breath and no chest pain. He reports Dr. Harrell Gave had advised he could start medication if needed, if dietary/lifestyle changes did not work. He decreased intake at dinner. He has stopped caffeine, decreased chocolate, has not had wine in 3 days. He has continued palpitations, no rhyme or reason to why or when they occur. His Apple Watch report said he was in Afib.   He will send his Apple Watch report  Message routed to Dr. Harrell Gave & Dr. Martinique

## 2019-06-11 ENCOUNTER — Telehealth: Payer: Self-pay | Admitting: Cardiology

## 2019-06-11 MED ORDER — DILTIAZEM HCL ER COATED BEADS 120 MG PO CP24: 120.0000 mg | ORAL_CAPSULE | Freq: Every day | ORAL | 6 refills | Status: DC

## 2019-06-11 NOTE — Telephone Encounter (Signed)
I spoke with pt and gave him information from Dr Martinique. Will send prescription to [facility] on [address].  He reports he was unable to send readings from his watch through my chart. He contacted my chart assistance phone number but was still unable to attach readings.  He will either mail these to office or drop them off.

## 2019-06-11 NOTE — Telephone Encounter (Signed)
I would recommend starting Diltiazem SR 120 mg daily. Let us know if this helps.  Auston Halfmann Martinique MD, [facility]

## 2019-06-11 NOTE — Telephone Encounter (Signed)
I reviewed Ecg strip patient sent by mail from his apple watch. It showed Afib with rate 77. Baseline is somewhat difficult but does appear to be Afib. Started on dilitiazem. Will monitor response  Nicklous Aburto Martinique MD, [facility]

## 2019-06-12 ENCOUNTER — Telehealth: Payer: Self-pay

## 2019-06-12 NOTE — Telephone Encounter (Signed)
Spoke to patient Dr.Jordan received ekg from your smart watch.He advised does look like afib,rate controlled.Advised no change,continue same medications.Advised continue to monitor.Stated he would like to see Dr.Jordan before March.Appointment scheduled with Dr.Jordan 07/08/19 at 10:20 am.Advised to record any more episodes and bring to appointment.

## 2019-06-28 ENCOUNTER — Ambulatory Visit: Payer: Medicare HMO | Attending: Internal Medicine

## 2019-06-28 DIAGNOSIS — Z23 Encounter for immunization: Secondary | ICD-10-CM | POA: Insufficient documentation

## 2019-06-28 NOTE — Progress Notes (Signed)
   Covid-19 Vaccination Clinic  Name:  Jim Nguyen    MRN: [id number] DOB: Mar 11, 1951  06/28/2019  Jim Nguyen was observed post Covid-19 immunization for 15 minutes without incidence. He was provided with Vaccine Information Sheet and instruction to access the V-Safe system.   Jim Nguyen was instructed to call 911 with any severe reactions post vaccine: Marland Kitchen Difficulty breathing  . Swelling of your face and throat  . A fast heartbeat  . A bad rash all over your body  . Dizziness and weakness    Immunizations Administered    Name Date Dose VIS Date Route   Pfizer COVID-19 Vaccine 06/28/2019 11:39 AM 0.3 mL 05/17/2019 Intramuscular   Manufacturer: [place]   Lot: [id number]   [place]: [id number]

## 2019-07-01 NOTE — Progress Notes (Signed)
Cardiology Office Note   Date:  07/08/2019   ID:  Jim Nguyen, Jim Nguyen 11-05-1950, MRN [id number]  PCP:  Crist Infante, MD  Cardiologist:   Qais Jowers Martinique, MD   Chief Complaint  Patient presents with  . Follow-up    3 months.      History of Present Illness: Jim Nguyen is a 69 y.o. male is seen at the request of Dr Joylene Draft for evaluation of palpitations.  He has a history of HTN and HLD. He was last seen in 2016 with complaints of palpitations. Holter monitor then showed PACs.   He was seen by Dr Harrell Gave in December with increased palpitations. Event monitor showed frequent but brief runs of PAT. Echo was normal. Initially lifestyle modification was pursued but he continued to have symptoms and sent in an Apple watch tracing that appeared to show Afib with rate 77. He was started on Diltiazem CD 120 mg daily. He states he had a couple more episodes with HR faster so we increased diltiazem to 240 mg daily. Since then he has no recurrent episodes.   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 He denies  dizziness, chest pain or SOB. He stopped drinking caffeine. Does not see any relation to alcohol.     Past Medical History:  Diagnosis Date  . Arthritis   . Gout   . History of gallstones   . Hypercholesterolemia   . Hypertension   . Obesity   . Tobacco user   . Vertigo     Past Surgical History:  Procedure Laterality Date  . CHOLECYSTECTOMY    . COLONOSCOPY  2007     Current Outpatient Medications  Medication Sig Dispense Refill  . allopurinol (ZYLOPRIM) 300 MG tablet Take 300 mg by mouth daily.    Marland Kitchen atorvastatin (LIPITOR) 20 MG tablet Take 20 mg by mouth daily.    Marland Kitchen telmisartan (MICARDIS) 20 MG tablet Take 20 mg by mouth daily.    Marland Kitchen diltiazem (CARDIZEM CD) 240 MG 24 hr capsule Take 1 capsule (240 mg total) by mouth daily. 90 capsule 3   No current facility-administered medications for this visit.    Allergies:   Patient has no known allergies.     Social History:  The patient  reports that he quit smoking about 40 years ago. His smoking use included cigars. He smoked 0.50 packs per day. He has never used smokeless tobacco. He reports current alcohol use of about 10.0 - 12.0 standard drinks of alcohol per week. He reports that he does not use drugs.   Family History:  The patient's family history includes Heart disease in his father; Pancreatic cancer in his father.    ROS:  Please see the history of present illness.   Otherwise, review of systems are positive for none.   All other systems are reviewed and negative.    PHYSICAL EXAM: VS:  BP 128/68 (BP Location: Left Arm, Patient Position: Sitting, Cuff Size: Normal)   Pulse 75   Temp (!) 96.1 F (35.6 C)   Ht 5\' 10"  (1.778 m)   Wt 218 lb (98.9 kg)   BMI 31.28 kg/m  , BMI Body mass index is 31.28 kg/m. GEN: Well nourished, overweight, in no acute distress  HEENT: normal  Neck: no JVD, carotid bruits, or masses Cardiac: RRR; no murmurs, rubs, or gallops,no edema  Respiratory:  clear to auscultation bilaterally, normal work of breathing GI: soft, nontender, nondistended, + BS MS: no  deformity or atrophy  Skin: warm and dry, no rash Neuro:  Strength and sensation are intact Psych: euthymic mood, full affect   EKG:  EKG is not ordered today. Reviewed 3 tracings on Apple watch that are all Afib.     Recent Labs: 04/03/2019: BUN 15; Creatinine, Ser 0.87; Magnesium 2.2; Potassium 4.4; Sodium 138    Lipid Panel    Component Value Date/Time   CHOL 125 03/29/2011 0836   TRIG 141.0 03/29/2011 0836   HDL 33.50 (L) 03/29/2011 0836   CHOLHDL 4 03/29/2011 0836   VLDL 28.2 03/29/2011 0836   LDLCALC 63 03/29/2011 0836    Labs dated 07/20/18: cholesterol 107, triglycerides 112, HDL 34, LDL 51. A1c 5.4%.  Dated 01/11/19: CBC, CMET, TSH normal   Wt Readings from Last 3 Encounters:  07/08/19 218 lb (98.9 kg)  05/20/19 217 lb (98.4 kg)  04/03/19 228 lb (103.4 kg)       Other studies Reviewed: Additional studies/ records that were reviewed today include:  Echo 04/15/19: IMPRESSIONS    1. Left ventricular ejection fraction, by visual estimation, is 65 to 70%. The left ventricle has hyperdynamic function. There is no left ventricular hypertrophy.  2. Left ventricular diastolic parameters are consistent with Grade I diastolic dysfunction (impaired relaxation).  3. Global right ventricle has normal systolic function.The right ventricular size is normal. No increase in right ventricular wall thickness.  4. Left atrial size was mildly dilated.  5. Right atrial size was normal.  6. Mild mitral annular calcification.  7. The mitral valve is abnormal. Trace mitral valve regurgitation.  8. The tricuspid valve is normal in structure. Tricuspid valve regurgitation is mild.  9. The aortic valve is tricuspid. Aortic valve regurgitation is not visualized. Mild aortic valve sclerosis without stenosis. 10. The pulmonic valve was normal in structure. Pulmonic valve regurgitation is trivial.  Event monitor 05/09/19:Study Highlights   NSR  Repetitive runs of PAT longest lasting 1 minute and 13 seconds  Patient triggered events correlate with PAT       ASSESSMENT AND PLAN:  1.  PAfib and PAT  Now on Diltiazem.  Echo normal. Will monitor how he does on medical therapy. He has a [place] vasc score of 1. Discussed pros and cons of anticoagulation and he would like to avoid this for now. I am in agreement.  2. HTN well controlled 3. Hypercholesterolemia. Well controlled.   Current medicines are reviewed at length with the patient today.  The patient does not have concerns regarding medicines.  The following changes have been made:  no change  Labs/ tests ordered today include:  No orders of the defined types were placed in this encounter.    Disposition:   FU 6 months  Signed, Nyko Gell Martinique, MD  07/08/2019 10:57 AM    [facility] [address], [place], [place], [address] Phone [phone], Fax [phone]

## 2019-07-02 ENCOUNTER — Telehealth: Payer: Self-pay

## 2019-07-02 ENCOUNTER — Telehealth: Payer: Self-pay | Admitting: Cardiology

## 2019-07-02 NOTE — Telephone Encounter (Signed)
Pt c/o medication issue:  1. Name of Medication: diltiazem (CARDIZEM CD) 120 MG 24 hr capsule  2. How are you currently taking this medication (dosage and times per day)? Once a day  3. Are you having a reaction (difficulty breathing--STAT)? no  4. What is your medication issue? Patient states he has been having afib episodes and does not believe the medication is helping. He wants to know if he should be seen this week instead of his appointment next week 2/1.

## 2019-07-02 NOTE — Telephone Encounter (Signed)
He can double up on the diltiazem until I see him next week. We can discuss further options for treatment then  Avin Gibbons Martinique MD, [facility]

## 2019-07-02 NOTE — Telephone Encounter (Signed)
3 times lately where heart racing, apple watch shows Afib  Episodes last about 1 & 1/2 minutes, HR 130's then goes to 72 and sinus rhythm  Has had episodes of heart skipping and watch shows SR Blood pressure running around 115/65  Taking Diltiazem 120 mg daily  He has cut out his 1 or 2 glasses of wine in the evening and has not noticed any change. Does not drink caffeine  Has not noticed any triggers Has appointment with Dr Martinique 07/08/2019 but wanted to make sure does not need to be seen sooner/medication changes made Will forward to Dr Martinique for review

## 2019-07-02 NOTE — Telephone Encounter (Signed)
Returned call to patient no answer.LMTC. 

## 2019-07-02 NOTE — Telephone Encounter (Signed)
Advised patient, verbalized understanding  

## 2019-07-02 NOTE — Telephone Encounter (Signed)
Received a call from patient he wanted to know if he could take another diltiazem 120 mg now.Advised he can.Advised ok to take Diltiazem CD 120 mg 2 tablets every morning until his appointment with Dr.Jordan 07/08/19.

## 2019-07-08 ENCOUNTER — Ambulatory Visit (INDEPENDENT_AMBULATORY_CARE_PROVIDER_SITE_OTHER): Payer: Medicare HMO | Admitting: Cardiology

## 2019-07-08 ENCOUNTER — Encounter: Payer: Self-pay | Admitting: Cardiology

## 2019-07-08 ENCOUNTER — Other Ambulatory Visit: Payer: Self-pay

## 2019-07-08 VITALS — BP 128/68 | HR 75 | Temp 96.1°F | Ht 70.0 in | Wt 218.0 lb

## 2019-07-08 DIAGNOSIS — I471 Supraventricular tachycardia: Secondary | ICD-10-CM

## 2019-07-08 DIAGNOSIS — I10 Essential (primary) hypertension: Secondary | ICD-10-CM | POA: Diagnosis not present

## 2019-07-08 DIAGNOSIS — I480 Paroxysmal atrial fibrillation: Secondary | ICD-10-CM

## 2019-07-08 MED ORDER — DILTIAZEM HCL ER COATED BEADS 240 MG PO CP24: 240.0000 mg | ORAL_CAPSULE | Freq: Every day | ORAL | 3 refills | Status: DC

## 2019-07-19 ENCOUNTER — Ambulatory Visit: Payer: Medicare HMO | Attending: Internal Medicine

## 2019-07-19 DIAGNOSIS — Z23 Encounter for immunization: Secondary | ICD-10-CM | POA: Insufficient documentation

## 2019-07-19 NOTE — Progress Notes (Signed)
   Covid-19 Vaccination Clinic  Name:  Jim Nguyen    MRN: [id number] DOB: 1951/04/13  07/19/2019  Mr. [name] was observed post Covid-19 immunization for 15 minutes without incidence. He was provided with Vaccine Information Sheet and instruction to access the V-Safe system.   Mr. [name] was instructed to call 911 with any severe reactions post vaccine: Marland Kitchen Difficulty breathing  . Swelling of your face and throat  . A fast heartbeat  . A bad rash all over your body  . Dizziness and weakness    Immunizations Administered    Name Date Dose VIS Date Route   Pfizer COVID-19 Vaccine 07/19/2019  4:13 PM 0.3 mL 05/17/2019 Intramuscular   Manufacturer: [place]   Lot: [id number]   [place]: [id number]

## 2019-07-26 ENCOUNTER — Ambulatory Visit: Payer: Medicare HMO

## 2019-08-12 DIAGNOSIS — R7301 Impaired fasting glucose: Secondary | ICD-10-CM | POA: Diagnosis not present

## 2019-08-12 DIAGNOSIS — Z0000 Encounter for general adult medical examination without abnormal findings: Secondary | ICD-10-CM | POA: Diagnosis not present

## 2019-08-12 DIAGNOSIS — E7849 Other hyperlipidemia: Secondary | ICD-10-CM | POA: Diagnosis not present

## 2019-08-12 DIAGNOSIS — M109 Gout, unspecified: Secondary | ICD-10-CM | POA: Diagnosis not present

## 2019-08-12 DIAGNOSIS — Z125 Encounter for screening for malignant neoplasm of prostate: Secondary | ICD-10-CM | POA: Diagnosis not present

## 2019-08-26 ENCOUNTER — Ambulatory Visit: Payer: Medicare HMO | Admitting: Cardiology

## 2019-09-02 DIAGNOSIS — R002 Palpitations: Secondary | ICD-10-CM | POA: Diagnosis not present

## 2019-09-02 DIAGNOSIS — M109 Gout, unspecified: Secondary | ICD-10-CM | POA: Diagnosis not present

## 2019-09-02 DIAGNOSIS — I10 Essential (primary) hypertension: Secondary | ICD-10-CM | POA: Diagnosis not present

## 2019-09-02 DIAGNOSIS — D126 Benign neoplasm of colon, unspecified: Secondary | ICD-10-CM | POA: Diagnosis not present

## 2019-09-02 DIAGNOSIS — Z0000 Encounter for general adult medical examination without abnormal findings: Secondary | ICD-10-CM | POA: Diagnosis not present

## 2019-09-02 DIAGNOSIS — R945 Abnormal results of liver function studies: Secondary | ICD-10-CM | POA: Diagnosis not present

## 2019-09-02 DIAGNOSIS — D7589 Other specified diseases of blood and blood-forming organs: Secondary | ICD-10-CM | POA: Diagnosis not present

## 2019-09-02 DIAGNOSIS — R7301 Impaired fasting glucose: Secondary | ICD-10-CM | POA: Diagnosis not present

## 2019-09-02 DIAGNOSIS — N529 Male erectile dysfunction, unspecified: Secondary | ICD-10-CM | POA: Diagnosis not present

## 2019-09-02 DIAGNOSIS — R82998 Other abnormal findings in urine: Secondary | ICD-10-CM | POA: Diagnosis not present

## 2019-09-02 DIAGNOSIS — M545 Low back pain: Secondary | ICD-10-CM | POA: Diagnosis not present

## 2019-09-02 DIAGNOSIS — E669 Obesity, unspecified: Secondary | ICD-10-CM | POA: Diagnosis not present

## 2019-09-04 DIAGNOSIS — Z1212 Encounter for screening for malignant neoplasm of rectum: Secondary | ICD-10-CM | POA: Diagnosis not present

## 2019-09-11 ENCOUNTER — Telehealth: Payer: Self-pay | Admitting: Cardiology

## 2019-09-11 ENCOUNTER — Ambulatory Visit (INDEPENDENT_AMBULATORY_CARE_PROVIDER_SITE_OTHER): Payer: Medicare HMO | Admitting: Physician Assistant

## 2019-09-11 ENCOUNTER — Encounter: Payer: Self-pay | Admitting: Physician Assistant

## 2019-09-11 ENCOUNTER — Other Ambulatory Visit: Payer: Self-pay

## 2019-09-11 VITALS — BP 130/66 | HR 97 | Temp 96.5°F | Ht 70.0 in | Wt 219.8 lb

## 2019-09-11 DIAGNOSIS — E785 Hyperlipidemia, unspecified: Secondary | ICD-10-CM

## 2019-09-11 DIAGNOSIS — I10 Essential (primary) hypertension: Secondary | ICD-10-CM | POA: Diagnosis not present

## 2019-09-11 DIAGNOSIS — R400 Somnolence: Secondary | ICD-10-CM | POA: Diagnosis not present

## 2019-09-11 DIAGNOSIS — I480 Paroxysmal atrial fibrillation: Secondary | ICD-10-CM | POA: Diagnosis not present

## 2019-09-11 MED ORDER — APIXABAN 5 MG PO TABS: 5.0000 mg | ORAL_TABLET | Freq: Two times a day (BID) | ORAL | 0 refills | Status: DC

## 2019-09-11 MED ORDER — DILTIAZEM HCL ER COATED BEADS 360 MG PO CP24: 360.0000 mg | ORAL_CAPSULE | Freq: Every day | ORAL | 0 refills | Status: DC

## 2019-09-11 NOTE — Progress Notes (Signed)
Cardiology Office Note:    Date:  09/13/2019   ID:  Jim Nguyen, DOB 10-Sep-1950, MRN [id number]  PCP:  Crist Infante, MD  Cardiologist:  Peter Martinique, MD  Electrophysiologist:  None   Referring MD: Crist Infante, MD   Chief Complaint  Patient presents with  . Follow-up    seen for Dr. Martinique    History of Present Illness:    Jim Nguyen is a 69 y.o. male with a hx of hypertension, hyperlipidemia and atrial fibrillation.  Patient was previously seen by Dr. Harrell Gave in December 2020 for increased palpitation.  7 day event monitor at the time showed frequent but brief runs of PAT.  Echocardiogram was normal.  Due to persistent chest pain, he sent in some apple watch tracing that appears to show atrial fibrillation.  Diltiazem was increased to 240 mg daily.  He was last seen by Dr. Martinique on 07/08/2019, at which time, Dr. Martinique discussed with the patient pros and cons of anticoagulation and he preferred to avoid systemic anticoagulation for the time being. His CHA2DS2-Vasc score is 2 (age, HTN)  Patient presents today for cardiology office visit due to recurrent palpitation since yesterday.  EKG confirmed he has went back into atrial fibrillation.  He denies any obvious chest pain, shortness of breath or significant fatigue.  We discussed again regarding systemic anticoagulation therapy, I think he would benefit from Eliquis 5 mg twice daily.  There is significant risk reduction with anticoagulation therapy.  I also recommended further increase his diltiazem to 360 mg daily.  I plan to see the patient back in 2 to 3 weeks, if he is still using atrial fibrillation, I plan to discuss with him regarding outpatient cardioversion once he is able to complete 3 weeks of anticoagulation therapy.  According to the patient, he does snore at home, given increased risk of atrial fibrillation in the setting of obstructive sleep apnea, I recommended a sleep study.   Past Medical History:  Diagnosis  Date  . Arthritis   . Gout   . History of gallstones   . Hypercholesterolemia   . Hypertension   . Obesity   . Tobacco user   . Vertigo     Past Surgical History:  Procedure Laterality Date  . CHOLECYSTECTOMY    . COLONOSCOPY  2007    Current Medications: Current Meds  Medication Sig  . allopurinol (ZYLOPRIM) 300 MG tablet Take 300 mg by mouth daily.  Marland Kitchen atorvastatin (LIPITOR) 20 MG tablet Take 20 mg by mouth daily.  Marland Kitchen diltiazem (CARDIZEM CD) 360 MG 24 hr capsule Take 1 capsule (360 mg total) by mouth daily.  . [DISCONTINUED] diltiazem (CARDIZEM CD) 240 MG 24 hr capsule Take 1 capsule (240 mg total) by mouth daily.  . [DISCONTINUED] telmisartan (MICARDIS) 20 MG tablet Take 20 mg by mouth daily.     Allergies:   Patient has no known allergies.   Social History   Socioeconomic History  . Marital status: Married    Spouse name: Not on file  . Number of children: 1  . Years of education: Not on file  . Highest education level: Not on file  Occupational History  . 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 Smoking status: Former Smoker    Packs/day: 0.50    Types: Cigars    Quit date: 01/23/1979    Years since quitting: 40.6  . Smokeless tobacco: Never Used  Substance and Sexual Activity  . Alcohol use:  Yes    Alcohol/week: 10.0 - 12.0 standard drinks    Types: 10 - 12 Glasses of wine per week  . Drug use: No  . Sexual activity: Not on file  Other Topics Concern  . Not on file  Social History Narrative  . Not on file   Social Determinants of Health   Financial Resource Strain:   . Difficulty of Paying Living Expenses:   Food Insecurity:   . Worried About Charity fundraiser in the Last Year:   . Arboriculturist in the Last Year:   Transportation Needs:   . Film/video editor (Medical):   Marland Kitchen Lack of Transportation (Non-Medical):   Physical Activity:   . Days of Exercise per Week:   . Minutes of Exercise per Session:   Stress:   . Feeling of  Stress :   Social Connections:   . Frequency of Communication with Friends and Family:   . Frequency of Social Gatherings with Friends and Family:   . Attends Religious Services:   . Active Member of Clubs or Organizations:   . Attends Archivist Meetings:   Marland Kitchen Marital Status:      Family History: The patient's family history includes Heart disease in his father; Pancreatic cancer in his father. There is no history of Colon cancer.  ROS:   Please see the history of present illness.     All other systems reviewed and are negative.  EKGs/Labs/Other Studies Reviewed:    The following studies were reviewed today:  Echo 04/15/2019 1. Left ventricular ejection fraction, by visual estimation, is 65 to 70%. The left ventricle has hyperdynamic function. There is no left ventricular hypertrophy.  2. Left ventricular diastolic parameters are consistent with Grade I diastolic dysfunction (impaired relaxation).  3. Global right ventricle has normal systolic function.The right  ventricular size is normal. No increase in right ventricular wall  thickness.  4. Left atrial size was mildly dilated.  5. Right atrial size was normal.  6. Mild mitral annular calcification.  7. The mitral valve is abnormal. Trace mitral valve regurgitation.  8. The tricuspid valve is normal in structure. Tricuspid valve  regurgitation is mild.  9. The aortic valve is tricuspid. Aortic valve regurgitation is not visualized. Mild aortic valve sclerosis without stenosis.  10. The pulmonic valve was normal in structure. Pulmonic valve regurgitation is trivial.   1 week heart monitor 05/09/2019  NSR  Repetitive runs of PAT longest lasting 1 minute and 13 seconds  Patient triggered events correlate with PAT  EKG:  EKG is ordered today.  The ekg ordered today demonstrates atrial fibrillation, HR 97  Recent Labs: 04/03/2019: BUN 15; Creatinine, Ser 0.87; Magnesium 2.2; Potassium 4.4; Sodium 138   Recent Lipid Panel    Component Value Date/Time   CHOL 125 03/29/2011 0836   TRIG 141.0 03/29/2011 0836   HDL 33.50 (L) 03/29/2011 0836   CHOLHDL 4 03/29/2011 0836   VLDL 28.2 03/29/2011 0836   LDLCALC 63 03/29/2011 0836    Physical Exam:    VS:  BP 130/66   Pulse 97   Temp (!) 96.5 F (35.8 C) Comment: Forehead  Ht 5\' 10"  (1.778 m)   Wt 219 lb 12.8 oz (99.7 kg)   SpO2 99%   BMI 31.54 kg/m     Wt Readings from Last 3 Encounters:  09/11/19 219 lb 12.8 oz (99.7 kg)  07/08/19 218 lb (98.9 kg)  05/20/19 217 lb (98.4 kg)  GEN:  Well nourished, well developed in no acute distress HEENT: Normal NECK: No JVD; No carotid bruits LYMPHATICS: No lymphadenopathy CARDIAC: RRR, no murmurs, rubs, gallops RESPIRATORY:  Clear to auscultation without rales, wheezing or rhonchi  ABDOMEN: Soft, non-tender, non-distended MUSCULOSKELETAL:  No edema; No deformity  SKIN: Warm and dry NEUROLOGIC:  Alert and oriented x 3 PSYCHIATRIC:  Normal affect   ASSESSMENT:    1. PAF (paroxysmal atrial fibrillation) ([place])   2. Daytime somnolence   3. Essential hypertension   4. Hyperlipidemia LDL goal <100    PLAN:    In order of problems listed above:  1. PAF: I discussed the benefit and risk of anticoagulation with the patient, given recurrence of atrial fibrillation, I recommended starting on Eliquis 5 mg twice daily.  I will also increase his diltiazem to 360 mg daily.  I plan to see the patient back in 2 to 3 weeks, if he is still yet atrial fibrillation, will discuss with him regarding outpatient cardioversion  2. Daytime somnolence: He has a history of snoring and now recurrent atrial fibrillation, I recommend a home sleep study  3. Hypertension: Blood pressure stable on the current therapy, will increase diltiazem CD to 360 mg daily for rate control purposes  4. Hyperlipidemia: On Lipitor 20 mg daily   Medication Adjustments/Labs and Tests Ordered: Current medicines are reviewed at  length with the patient today.  Concerns regarding medicines are outlined above.  Orders Placed This Encounter  Procedures  . EKG 12-Lead  . Split night study   Meds ordered this encounter  Medications  . diltiazem (CARDIZEM CD) 360 MG 24 hr capsule    Sig: Take 1 capsule (360 mg total) by mouth daily.    Dispense:  30 capsule    Refill:  0  . apixaban (ELIQUIS) 5 MG TABS tablet    Sig: Take 1 tablet (5 mg total) by mouth 2 (two) times daily.    Dispense:  60 tablet    Refill:  0    Patient Instructions  Medication Instructions:   STOP Telmisartan  INCREASE Diltiazem CD to 360 mg daily  START Eliquis 5 mg 2 times a day  *If you need a refill on your cardiac medications before your next appointment, please call your pharmacy*  Lab Work: NONE ordered at this time of appointment   If you have labs (blood work) drawn today and your tests are completely normal, you will receive your results only by: Marland Kitchen MyChart Message (if you have MyChart) OR . A paper copy in the mail If you have any lab test that is abnormal or we need to change your treatment, we will call you to review the results.  Testing/Procedures: Your physician has recommended that you have a sleep study. This test records several body functions during sleep, including: brain activity, eye movement, oxygen and carbon dioxide blood levels, heart rate and rhythm, breathing rate and rhythm, the flow of air through your mouth and nose, snoring, body muscle movements, and chest and belly movement.   PLEASE SCHEDULE FOR 1-2 MONTHS   Follow-Up: At [facility] you and your health needs are our priority.  As part of our continuing mission to provide you with exceptional heart care, we have created designated Provider Care Teams.  These Care Teams include your primary Cardiologist (physician) and Advanced Practice Providers (APPs -  Physician Assistants and Nurse Practitioners) who all work together to provide you with the  care you need, when you need  it.   Your next appointment:   2 week(s)  The format for your next appointment:   In Person  Provider:   Peter Martinique, MD or Almyra Deforest, PA-C  Other Instructions      Signed, Almyra Deforest, [place]  09/13/2019 11:06 PM    [place]

## 2019-09-11 NOTE — Patient Instructions (Signed)
Medication Instructions:   STOP Telmisartan  INCREASE Diltiazem CD to 360 mg daily  START Eliquis 5 mg 2 times a day  *If you need a refill on your cardiac medications before your next appointment, please call your pharmacy*  Lab Work: NONE ordered at this time of appointment   If you have labs (blood work) drawn today and your tests are completely normal, you will receive your results only by: Marland Kitchen MyChart Message (if you have MyChart) OR . A paper copy in the mail If you have any lab test that is abnormal or we need to change your treatment, we will call you to review the results.  Testing/Procedures: Your physician has recommended that you have a sleep study. This test records several body functions during sleep, including: brain activity, eye movement, oxygen and carbon dioxide blood levels, heart rate and rhythm, breathing rate and rhythm, the flow of air through your mouth and nose, snoring, body muscle movements, and chest and belly movement.   PLEASE SCHEDULE FOR 1-2 MONTHS   Follow-Up: At [facility], you and your health needs are our priority.  As part of our continuing mission to provide you with exceptional heart care, we have created designated Provider Care Teams.  These Care Teams include your primary Cardiologist (physician) and Advanced Practice Providers (APPs -  Physician Assistants and Nurse Practitioners) who all work together to provide you with the care you need, when you need it.   Your next appointment:   2 week(s)  The format for your next appointment:   In Person  Provider:   Peter Martinique, MD or Almyra Deforest, PA-C  Other Instructions

## 2019-09-11 NOTE — Telephone Encounter (Signed)
Returned call to pt he states that his apple watch has been registering pus

## 2019-09-11 NOTE — Telephone Encounter (Signed)
New message      Patient states that he is in AFIB and would like to talk to the nurse.

## 2019-09-11 NOTE — Telephone Encounter (Signed)
Returned call to pt he states that his apple watch has been notifying him that he is in AFIB and HR between 80-110's he has been feeling fluttering but denies any other sx. He states that he would like to change/adjust his medication. Appt made today to discuss

## 2019-09-13 ENCOUNTER — Encounter: Payer: Self-pay | Admitting: Physician Assistant

## 2019-09-18 ENCOUNTER — Telehealth: Payer: Self-pay | Admitting: Physician Assistant

## 2019-09-18 NOTE — Telephone Encounter (Signed)
Patient c/o Palpitations:  High priority if patient c/o lightheadedness, shortness of breath, or chest pain  1) How long have you had palpitations/irregular HR/ Afib? Are you having the symptoms now? Yes, skipping heat beats  2) Are you currently experiencing lightheadedness, SOB or CP?  no  3) Do you have a history of afib (atrial fibrillation) or irregular heart rhythm? yes  4) Have you checked your BP or HR? (document readings if available): HR 64  5) Are you experiencing any other symptoms? No  Patient states he would like a call back to discuss his heart skipping beats for the last few days. He states his medications were recently changed and states he is also taking the vitamin CoQ10. He says his diltiazem was increased to 360 mg and was told to stop taking telmisartan 20 mg. He would like someone to call him back today.

## 2019-09-18 NOTE — Telephone Encounter (Signed)
Patient is returning phone call.  °

## 2019-09-18 NOTE — Telephone Encounter (Signed)
Spoke with pt who report he has been out of Afib since Sunday but now just having occasional episodes of brief flutter feeling throughout the day. He report he's asymptomatic but wondering if symptoms could be related to increased dose of medication. Pt also inquiring if he is ok to take COQ10 with current medications.   Will route to PA for recommendations.

## 2019-09-18 NOTE — Telephone Encounter (Signed)
Left message to call back  

## 2019-09-19 NOTE — Telephone Encounter (Signed)
Pt calling in to follow up on call from previous day.  He stated he was trying to reach someone for an hour and then was disconnected. He reports seeing Isaac Laud last Wednesday and he was taken off his Telmesartan. He reports being in afib from Tuesday to Friday and is now just having "skip beats" He denies chest pain and sob, reports feeling fine, just feels fluttering at times. He also reports that his BP has been fluctuating as well which is not normal for him (106/64 - 140/78)  Spoke with Billey Chang PA who stated he would call the pt around lunch time today to discuss.  Notified pt who verbalized understanding and had no other questions at this time.

## 2019-09-19 NOTE — Telephone Encounter (Signed)
Spoke with [name], I suspect he is describing PVCs. He says he converted to sinus rhythm last Friday on the higher dose of diltiazem. But this past Monday night, he started noticing skipped heart beat. Will bring the patient back to the clinic tomorrow at 4:15 pm for reassessment and EKG strip

## 2019-09-20 ENCOUNTER — Other Ambulatory Visit: Payer: Self-pay

## 2019-09-20 ENCOUNTER — Ambulatory Visit (INDEPENDENT_AMBULATORY_CARE_PROVIDER_SITE_OTHER): Payer: Medicare HMO | Admitting: Physician Assistant

## 2019-09-20 ENCOUNTER — Telehealth: Payer: Self-pay | Admitting: *Deleted

## 2019-09-20 ENCOUNTER — Encounter: Payer: Self-pay | Admitting: Physician Assistant

## 2019-09-20 VITALS — BP 138/74 | HR 63 | Temp 97.9°F | Ht 70.0 in | Wt 221.2 lb

## 2019-09-20 DIAGNOSIS — I480 Paroxysmal atrial fibrillation: Secondary | ICD-10-CM

## 2019-09-20 DIAGNOSIS — I10 Essential (primary) hypertension: Secondary | ICD-10-CM | POA: Diagnosis not present

## 2019-09-20 DIAGNOSIS — Z79899 Other long term (current) drug therapy: Secondary | ICD-10-CM

## 2019-09-20 DIAGNOSIS — E785 Hyperlipidemia, unspecified: Secondary | ICD-10-CM

## 2019-09-20 DIAGNOSIS — R400 Somnolence: Secondary | ICD-10-CM

## 2019-09-20 NOTE — Patient Instructions (Signed)
Medication Instructions:  Your physician recommends that you continue on your current medications as directed. Please refer to the Current Medication list given to you today.  *If you need a refill on your cardiac medications before your next appointment, please call your pharmacy*  Lab Work: Your physician recommends that you return for lab work next Wednesday 09/25/2019:   CBC If you have labs (blood work) drawn today and your tests are completely normal, you will receive your results only by: Marland Kitchen MyChart Message (if you have MyChart) OR . A paper copy in the mail If you have any lab test that is abnormal or we need to change your treatment, we will call you to review the results.  Testing/Procedures: NONE ordered at this time of appointment   Follow-Up: At [facility], you and your health needs are our priority.  As part of our continuing mission to provide you with exceptional heart care, we have created designated Provider Care Teams.  These Care Teams include your primary Cardiologist (physician) and Advanced Practice Providers (APPs -  Physician Assistants and Nurse Practitioners) who all work together to provide you with the care you need, when you need it.  Your next appointment:   2-3 month(s)  The format for your next appointment:   In Person  Provider:   Peter Martinique, MD  Other Instructions

## 2019-09-20 NOTE — Telephone Encounter (Signed)
-----   Message from Roland Earl sent at 09/20/2019  5:01 PM EDT ----- Regarding: Sleep Study Almyra Deforest, [place]

## 2019-09-20 NOTE — Progress Notes (Signed)
Cardiology Office Note:    Date:  09/22/2019   ID:  Jim Nguyen, DOB 21-Sep-1950, MRN [id number]  PCP:  Crist Infante, MD  Cardiologist:  Peter Martinique, MD  Electrophysiologist:  None   Referring MD: Crist Infante, MD   Chief Complaint  Patient presents with  . Follow-up    seen for Dr. Martinique    History of Present Illness:    Jim Nguyen is a 69 y.o. male with a hx of HTN, HLD and atrial fibrillation.  Patient was previously seen by Dr. Harrell Gave in December 2020 for increased palpitation.  7 day event monitor at the time showed frequent but brief runs of PAT.  Echocardiogram was normal.  Due to persistent chest pain, he sent in some apple watch tracing that appears to show atrial fibrillation.  Diltiazem was increased to 240 mg daily.  He was last seen by Dr. Martinique on 07/08/2019, at which time, Dr. Martinique discussed with the patient pros and cons of anticoagulation and he preferred to avoid systemic anticoagulation for the time being. His CHA2DS2-Vasc score is 2 (age, HTN).  I last saw the patient on 09/11/2019, he has went back into atrial fibrillation from the day before.  This was confirmed the both on EKG and his apple watch.  Given his CHA2DS2-Vasc score of 2, I recommended Eliquis 5 mg twice daily which offer significant stroke risk reduction without significant bleeding risk.  I further increase his diltiazem to 360 mg daily.  Given his snoring, I also recommended a sleep study as well.  I called the patient on 09/18/2019, he was complaining of intermittent skipped heartbeats.  I suspect he is describing few PVCs.  He also mentioned the time that he has converted back to sinus rhythm last Friday on 09/13/2019.  Patient presents today for cardiology office visit.  Since he converted back to sinus rhythm last Friday, he has been having occasional skipped heartbeat.  They may occur once every 3 to 4-minute however may not occur for several hours at a time.  Previous heart monitor in  December 2020 demonstrated PAT.  He denies any dizziness, blurred vision or feeling of passing out associated with a skipped heartbeat.  At this time I do not recommend any further work-up and recommended observation.  Overall, he is doing well from cardiology perspective.   Past Medical History:  Diagnosis Date  . Arthritis   . Gout   . History of gallstones   . Hypercholesterolemia   . Hypertension   . Obesity   . Tobacco user   . Vertigo     Past Surgical History:  Procedure Laterality Date  . CHOLECYSTECTOMY    . COLONOSCOPY  2007    Current Medications: Current Meds  Medication Sig  . allopurinol (ZYLOPRIM) 300 MG tablet Take 300 mg by mouth daily.  Marland Kitchen apixaban (ELIQUIS) 5 MG TABS tablet Take 1 tablet (5 mg total) by mouth 2 (two) times daily.  . Ascorbic Acid (VITAMIN C PO) Take by mouth.  Marland Kitchen atorvastatin (LIPITOR) 20 MG tablet Take 20 mg by mouth daily.  . Coenzyme Q10 (CO Q 10 PO) Take by mouth.  . diltiazem (CARDIZEM CD) 360 MG 24 hr capsule Take 1 capsule (360 mg total) by mouth daily.     Allergies:   Patient has no known allergies.   Social History   Socioeconomic History  . Marital status: Married    Spouse name: Not on file  . Number of children: 1  .  Years of education: Not on file  . Highest education level: Not on file  Occupational History  . 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 Smoking status: Former Smoker    Packs/day: 0.50    Types: Cigars    Quit date: 01/23/1979    Years since quitting: 40.6  . Smokeless tobacco: Never Used  Substance and Sexual Activity  . Alcohol use: Yes    Alcohol/week: 10.0 - 12.0 standard drinks    Types: 10 - 12 Glasses of wine per week  . Drug use: No  . Sexual activity: Not on file  Other Topics Concern  . Not on file  Social History Narrative  . Not on file   Social Determinants of Health   Financial Resource Strain:   . Difficulty of Paying Living Expenses:   Food Insecurity:   . Worried  About Charity fundraiser in the Last Year:   . Arboriculturist in the Last Year:   Transportation Needs:   . Film/video editor (Medical):   Marland Kitchen Lack of Transportation (Non-Medical):   Physical Activity:   . Days of Exercise per Week:   . Minutes of Exercise per Session:   Stress:   . Feeling of Stress :   Social Connections:   . Frequency of Communication with Friends and Family:   . Frequency of Social Gatherings with Friends and Family:   . Attends Religious Services:   . Active Member of Clubs or Organizations:   . Attends Archivist Meetings:   Marland Kitchen Marital Status:      Family History: The patient's family history includes Heart disease in his father; Pancreatic cancer in his father. There is no history of Colon cancer.  ROS:   Please see the history of present illness.     All other systems reviewed and are negative.  EKGs/Labs/Other Studies Reviewed:    The following studies were reviewed today:  Echo 04/15/2019 IMPRESSIONS    1. Left ventricular ejection fraction, by visual estimation, is 65 to  70%. The left ventricle has hyperdynamic function. There is no left  ventricular hypertrophy.  2. Left ventricular diastolic parameters are consistent with Grade I  diastolic dysfunction (impaired relaxation).  3. Global right ventricle has normal systolic function.The right  ventricular size is normal. No increase in right ventricular wall  thickness.  4. Left atrial size was mildly dilated.  5. Right atrial size was normal.  6. Mild mitral annular calcification.  7. The mitral valve is abnormal. Trace mitral valve regurgitation.  8. The tricuspid valve is normal in structure. Tricuspid valve  regurgitation is mild.  9. The aortic valve is tricuspid. Aortic valve regurgitation is not  visualized. Mild aortic valve sclerosis without stenosis.  10. The pulmonic valve was normal in structure. Pulmonic valve  regurgitation is trivial.   EKG:  EKG is  ordered today.  The ekg ordered today demonstrates normal sinus rhythm without significant ST-T wave changes.  Recent Labs: 04/03/2019: BUN 15; Creatinine, Ser 0.87; Magnesium 2.2; Potassium 4.4; Sodium 138  Recent Lipid Panel    Component Value Date/Time   CHOL 125 03/29/2011 0836   TRIG 141.0 03/29/2011 0836   HDL 33.50 (L) 03/29/2011 0836   CHOLHDL 4 03/29/2011 0836   VLDL 28.2 03/29/2011 0836   LDLCALC 63 03/29/2011 0836    Physical Exam:    VS:  BP 138/74   Pulse 63   Temp 97.9 F (36.6 C)  Ht 5\' 10"  (1.778 m)   Wt 221 lb 3.2 oz (100.3 kg)   SpO2 98%   BMI 31.74 kg/m     Wt Readings from Last 3 Encounters:  09/20/19 221 lb 3.2 oz (100.3 kg)  09/11/19 219 lb 12.8 oz (99.7 kg)  07/08/19 218 lb (98.9 kg)     GEN:  Well nourished, well developed in no acute distress HEENT: Normal NECK: No JVD; No carotid bruits LYMPHATICS: No lymphadenopathy CARDIAC: RRR, no murmurs, rubs, gallops RESPIRATORY:  Clear to auscultation without rales, wheezing or rhonchi  ABDOMEN: Soft, non-tender, non-distended MUSCULOSKELETAL:  No edema; No deformity  SKIN: Warm and dry NEUROLOGIC:  Alert and oriented x 3 PSYCHIATRIC:  Normal affect   ASSESSMENT:    1. PAF (paroxysmal atrial fibrillation) ([place])   2. Medication management   3. Essential hypertension   4. Hyperlipidemia LDL goal <100    PLAN:    In order of problems listed above:  1. PAF: He has converted to sinus rhythm since the last office visit.  During the last office visit, I increased his diltiazem dose and also started him on Eliquis.  He denies any bleeding issue.  He will need a CBC to make sure there is no significant anemia on the Eliquis.  He continued to have transient episode of palpitation which I suspect is either PAT or PVCs.  2. Hypertension: Blood pressure stable  3. Hyperlipidemia: On Lipitor.   Medication Adjustments/Labs and Tests Ordered: Current medicines are reviewed at length with the patient  today.  Concerns regarding medicines are outlined above.  Orders Placed This Encounter  Procedures  . CBC  . EKG 12-Lead   No orders of the defined types were placed in this encounter.   Patient Instructions  Medication Instructions:  Your physician recommends that you continue on your current medications as directed. Please refer to the Current Medication list given to you today.  *If you need a refill on your cardiac medications before your next appointment, please call your pharmacy*  Lab Work: Your physician recommends that you return for lab work next Wednesday 09/25/2019:   CBC If you have labs (blood work) drawn today and your tests are completely normal, you will receive your results only by: Marland Kitchen MyChart Message (if you have MyChart) OR . A paper copy in the mail If you have any lab test that is abnormal or we need to change your treatment, we will call you to review the results.  Testing/Procedures: NONE ordered at this time of appointment   Follow-Up: At [facility], you and your health needs are our priority.  As part of our continuing mission to provide you with exceptional heart care, we have created designated Provider Care Teams.  These Care Teams include your primary Cardiologist (physician) and Advanced Practice Providers (APPs -  Physician Assistants and Nurse Practitioners) who all work together to provide you with the care you need, when you need it.  Your next appointment:   2-3 month(s)  The format for your next appointment:   In Person  Provider:   Peter Martinique, MD  Other Instructions      Signed, Almyra Deforest, [place]  09/22/2019 11:56 PM    [place]

## 2019-09-22 ENCOUNTER — Encounter: Payer: Self-pay | Admitting: Physician Assistant

## 2019-09-24 ENCOUNTER — Ambulatory Visit: Payer: Medicare HMO | Admitting: Physician Assistant

## 2019-09-25 DIAGNOSIS — Z79899 Other long term (current) drug therapy: Secondary | ICD-10-CM | POA: Diagnosis not present

## 2019-09-25 LAB — CBC
Hematocrit: 42.7 % (ref 37.5–51.0)
Hemoglobin: 14.4 g/dL (ref 13.0–17.7)
MCH: 31.7 pg (ref 26.6–33.0)
MCHC: 33.7 g/dL (ref 31.5–35.7)
MCV: 94 fL (ref 79–97)
Platelets: 174 10*3/uL (ref 150–450)
RBC: 4.54 x10E6/uL (ref 4.14–5.80)
RDW: 12.8 % (ref 11.6–15.4)
WBC: 5.2 10*3/uL (ref 3.4–10.8)

## 2019-09-26 NOTE — Progress Notes (Signed)
Red blood cell count normal

## 2019-10-04 ENCOUNTER — Other Ambulatory Visit: Payer: Self-pay | Admitting: Physician Assistant

## 2019-10-07 NOTE — Telephone Encounter (Signed)
Patient is scheduled for lab study on  pt is scheduled for COVID screening on  10 am prior to SS.Marland Kitchen  Patient understands his sleep study will be done at [facility] sleep lab. Patient understands he will receive a sleep packet in a week or so. Patient understands to call if he does not receive the sleep packet in a timely manner. Patient agrees with treatment and thanked me for call Left detailed message on voicemail with date and time of titration and informed patient to call back to confirm or reschedule.

## 2019-10-07 NOTE — Telephone Encounter (Signed)
RE: precert Linnell Fulling, [place]; P Cv Div Sleep Studies  Cc: Newt Minion, RN; Dionicio Stall, RN  No Josem Kaufmann required.   Thank you,  [place]

## 2019-10-19 ENCOUNTER — Other Ambulatory Visit (HOSPITAL_COMMUNITY): Payer: Medicare HMO

## 2019-10-21 ENCOUNTER — Encounter (HOSPITAL_BASED_OUTPATIENT_CLINIC_OR_DEPARTMENT_OTHER): Payer: Medicare HMO | Admitting: Cardiovascular Disease

## 2019-10-22 ENCOUNTER — Encounter: Payer: Self-pay | Admitting: General Practice

## 2019-10-22 ENCOUNTER — Telehealth: Payer: Self-pay | Admitting: Cardiology

## 2019-10-22 ENCOUNTER — Other Ambulatory Visit: Payer: Self-pay

## 2019-10-22 ENCOUNTER — Ambulatory Visit (INDEPENDENT_AMBULATORY_CARE_PROVIDER_SITE_OTHER): Payer: Medicare HMO | Admitting: General Practice

## 2019-10-22 VITALS — BP 90/72 | HR 102 | Temp 97.9°F | Ht 70.0 in | Wt 219.0 lb

## 2019-10-22 DIAGNOSIS — E785 Hyperlipidemia, unspecified: Secondary | ICD-10-CM | POA: Diagnosis not present

## 2019-10-22 DIAGNOSIS — I10 Essential (primary) hypertension: Secondary | ICD-10-CM | POA: Diagnosis not present

## 2019-10-22 DIAGNOSIS — I480 Paroxysmal atrial fibrillation: Secondary | ICD-10-CM | POA: Diagnosis not present

## 2019-10-22 NOTE — Telephone Encounter (Signed)
Spoke with pt who report since Sunday he's noticed he has been in afib. He report normal resting HR 58-60 but for the past two day it's been averaging around 85-90. Pt denies SOB, feeling dizzy, or any other symptoms. Pt state he can just feel his heart racing. Appointment scheduled for today 5/18 at 3:15 pm with Sheela Stack, NP.

## 2019-10-22 NOTE — Progress Notes (Signed)
Cardiology Clinic Note   Patient Name: Jim Nguyen Date of Encounter: 10/22/2019  Primary Care Provider:  Crist Infante, MD Primary Cardiologist:  Peter Martinique, MD  Patient Profile    Jim Nguyen. [place] 69 year old male presents today for an evaluation of his atrial fibrillation.  Past Medical History    Past Medical History:  Diagnosis Date  . Arthritis   . Gout   . History of gallstones   . Hypercholesterolemia   . Hypertension   . Obesity   . Tobacco user   . Vertigo    Past Surgical History:  Procedure Laterality Date  . CHOLECYSTECTOMY    . COLONOSCOPY  2007    Allergies  No Known Allergies  History of Present Illness    [name] has a PMH of hypertension, hyperlipidemia, and atrial fibrillation.  He was seen by Dr. Harrell Gave 12/20 for increased palpitations.  A 7-day event monitor at the time showed frequent but brief runs of paroxysmal atrial tachycardia.  An echocardiogram during that time was normal.  Due to persistent chest pain he sent in apple watch tracings which appeared to show atrial fibrillation.  His diltiazem was increased to 240 mg daily.  He was seen by Dr. Martinique on 07/08/2019.  At that time Dr. Martinique discussed the pros and cons of anticoagulation and he preferred to avoid systemic anticoagulation at the time.  His [place] Vascor was 2 (Age, hypertension).  He was seen by Almyra Deforest, PA-C on 09/11/2019 during that time he was back in atrial fibrillation.  It was confirmed by EKG and his apple watch.  Eliquis 5 mg p.o. twice daily was recommended for CVA risk reduction.  His diltiazem was increased to 360 mg daily.  A sleep study was recommended due to his snoring.  He was contacted on 09/18/2019 due to complaints of intermittent skipped heartbeats.  It was suspected that these were PVCs.    He presented to the clinic on 09/20/2019.  He was noted to have converted back to NSR the Friday before.  From that time he only noticed occasional skipped heartbeats.   He indicated that they may happen every 3 to 4 minutes once at a time and in some cases they may not occur for several hours.  He denied dizziness, blurred vision, syncope.  No further work-up was recommended at that time.  Patient contacted nurse triage line on 10/22/2019 and indicated that he had been having episodes of resting heart rate between 85 and 90.  His normal resting heart rate is 58-60.  He denied shortness of breath, dizziness, and other cardiac symptoms.  He presents the clinic today for evaluation and states he was recently in [facility] over the weekend.  While he was there he played golf, ate at odd hours, and did not sleep as much as usual.  He states that he woke up over the weekend and noticed irregular heartbeats and a higher resting heart rate.  He does not feel fatigued, dizzy, or have increased  shortness of breath.  He has not missed any doses of his Eliquis and continues to take his diltiazem.  He states that he received a call prior to leaving for his trip about his sleep study for OSA.  He had asked about a in-home sleep study.  They have tried to set up with a out of home sleep study by mistake.  I will try to arrange this.  We talked about the options of continuing medication therapy, DCCV,  ablation.  He states that with prior episodes of atrial fibrillation he has converted on his own.  He has tried Valsalva maneuver without success.  However, he feels that his weekend away has had an impact on pushing him back into atrial fibrillation.  At this time he would like to wait another couple days before deciding whether to go ahead with DCCV or not.  I have instructed him to continue taking his Eliquis as directed.  BP today 90/72.  I will give him the triggers for atrial fibrillation, encouraged him to eat a heart healthy low-sodium diet, and have him follow-up with Dr. Martinique as scheduled.  Today he denies chest pain, shortness of breath, lower extremity edema, fatigue, palpitations,  melena, hematuria, hemoptysis, diaphoresis, weakness, presyncope, syncope, orthopnea, and PND.   Home Medications    Prior to Admission medications   Medication Sig Start Date End Date Taking? Authorizing Provider  allopurinol (ZYLOPRIM) 300 MG tablet Take 300 mg by mouth daily.    [provider]  Ascorbic Acid (VITAMIN C PO) Take by mouth.    [provider]  atorvastatin (LIPITOR) 20 MG tablet Take 20 mg by mouth daily. 01/10/19   [provider]  Coenzyme Q10 (CO Q 10 PO) Take by mouth.    [provider]  diltiazem (CARDIZEM CD) 360 MG 24 hr capsule TAKE 1 CAPSULE BY MOUTH EVERY DAY 10/07/19   Eulas Post, [place], PA  ELIQUIS 5 MG TABS tablet TAKE 1 TABLET BY MOUTH TWICE A DAY 10/07/19   Almyra Deforest, PA    Family History    Family History  Problem Relation Age of Onset  . Heart disease Father        CABG  . Pancreatic cancer Father   . Colon cancer Neg Hx    He indicated that his mother is deceased. He indicated that his father is deceased. He indicated that both of his sisters are alive. He indicated that his brother is alive. He indicated that the status of his neg hx is unknown.  Social History    Social History   Socioeconomic History  . Marital status: Married    Spouse name: Not on file  . Number of children: 1  . Years of education: Not on file  . Highest education level: Not on file  Occupational History  . 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 Smoking status: Former Smoker    Packs/day: 0.50    Types: Cigars    Quit date: 01/23/1979    Years since quitting: 40.7  . Smokeless tobacco: Never Used  Substance and Sexual Activity  . Alcohol use: Yes    Alcohol/week: 10.0 - 12.0 standard drinks    Types: 10 - 12 Glasses of wine per week  . Drug use: No  . Sexual activity: Not on file  Other Topics Concern  . Not on file  Social History Narrative  . Not on file   Social Determinants of Health   Financial Resource Strain:     . Difficulty of Paying Living Expenses:   Food Insecurity:   . Worried About Charity fundraiser in the Last Year:   . Arboriculturist in the Last Year:   Transportation Needs:   . Film/video editor (Medical):   Marland Kitchen Lack of Transportation (Non-Medical):   Physical Activity:   . Days of Exercise per Week:   . Minutes of Exercise per Session:   Stress:   .  Feeling of Stress :   Social Connections:   . Frequency of Communication with Friends and Family:   . Frequency of Social Gatherings with Friends and Family:   . Attends Religious Services:   . Active Member of Clubs or Organizations:   . Attends Archivist Meetings:   Marland Kitchen Marital Status:   Intimate Partner Violence:   . Fear of Current or Ex-Partner:   . Emotionally Abused:   Marland Kitchen Physically Abused:   . Sexually Abused:      Review of Systems    General:  No chills, fever, night sweats or weight changes.  Cardiovascular:  No chest pain, dyspnea on exertion, edema, orthopnea, palpitations, paroxysmal nocturnal dyspnea. Dermatological: No rash, lesions/masses Respiratory: No cough, dyspnea Urologic: No hematuria, dysuria Abdominal:   No nausea, vomiting, diarrhea, bright red blood per rectum, melena, or hematemesis Neurologic:  No visual changes, wkns, changes in mental status. All other systems reviewed and are otherwise negative except as noted above.  Physical Exam    VS:  BP 90/72 (BP Location: Left Arm, Patient Position: Sitting, Cuff Size: Normal)   Pulse (!) 102   Temp 97.9 F (36.6 C)   Ht 5\' 10"  (1.778 m)   Wt 219 lb (99.3 kg)   BMI 31.42 kg/m  , BMI Body mass index is 31.42 kg/m. GEN: Well nourished, well developed, in no acute distress. HEENT: normal. Neck: Supple, no JVD, carotid bruits, or masses. Cardiac: RRR, no murmurs, rubs, or gallops. No clubbing, cyanosis, edema.  Radials/DP/PT 2+ and equal bilaterally.  Respiratory:  Respirations regular and unlabored, clear to auscultation  bilaterally. GI: Soft, nontender, nondistended, BS + x 4. MS: no deformity or atrophy. Skin: warm and dry, no rash. Neuro:  Strength and sensation are intact. Psych: Normal affect.  Accessory Clinical Findings    ECG personally reviewed by me today-atrial fibrillation possible inferior infarct undetermined age 56 bpm- No acute changes  EKG 09/20/2019 Normal sinus rhythm 63 bpm  Echocardiogram 04/15/2019 IMPRESSIONS    1. Left ventricular ejection fraction, by visual estimation, is 65 to  70%. The left ventricle has hyperdynamic function. There is no left  ventricular hypertrophy.  2. Left ventricular diastolic parameters are consistent with Grade I  diastolic dysfunction (impaired relaxation).  3. Global right ventricle has normal systolic function.The right  ventricular size is normal. No increase in right ventricular wall  thickness.  4. Left atrial size was mildly dilated.  5. Right atrial size was normal.  6. Mild mitral annular calcification.  7. The mitral valve is abnormal. Trace mitral valve regurgitation.  8. The tricuspid valve is normal in structure. Tricuspid valve  regurgitation is mild.  9. The aortic valve is tricuspid. Aortic valve regurgitation is not  visualized. Mild aortic valve sclerosis without stenosis.  10. The pulmonic valve was normal in structure. Pulmonic valve  regurgitation is trivial.  Cardiac event monitor 05/09/2019  NSR  Repetitive runs of PAT longest lasting 1 minute and 13 seconds  Patient triggered events correlate with PAT  Assessment & Plan   1.  Paroxysmal atrial fibrillation-EKG today shows atrial fibrillation possible inferior infarct undetermined age 54 bpm.  Has noticed intermittent palpitations at home and increased resting heart rate.  Discussed options for putting patient back into normal sinus rhythm.  At this time he wishes to hold off on DCCV due to prior spontaneous conversion is back to NSR. Continue  diltiazem Avoid triggers caffeine, chocolate, EtOH etc. Heart healthy low-sodium diet Increase physical activity  as tolerated  Essential hypertension-BP today 90/72.  Well-controlled at home. Continue current medical therapy Heart healthy low-sodium diet-salty 6 given Increase physical activity as tolerated  Hyperlipidemia-LDL 63 on 03/29/2011 Continue statin Monitored by PCP  Disposition: Follow-up with Dr. Martinique as scheduled.   Jossie Ng. Kiri Hinderliter NP-C    10/22/2019, 4:20 PM [facility] [address] Office [phone] Fax [phone]

## 2019-10-22 NOTE — Telephone Encounter (Signed)
Patient c/o Palpitations:  High priority if patient c/o lightheadedness, shortness of breath, or chest pain  1) How long have you had palpitations/irregular HR/ Afib? Are you having the symptoms now?since Sunday night, yes  2) Are you currently experiencing lightheadedness, SOB or CP? no  3) Do you have a history of afib (atrial fibrillation) or irregular heart rhythm? yes  4) Have you checked your BP or HR? (document readings if available): BP 116/68 last night HR 85-90  5) Are you experiencing any other symptoms? No   Patient states he has been in afib since Sunday night. He states he is not having any other symptoms. He states his oxygen level is 97%. He states he would like a call back today.

## 2019-10-22 NOTE — Patient Instructions (Signed)
Medication Instructions:  The current medical regimen is effective;  continue present plan and medications as directed. Please refer to the Current Medication list given to you today. *If you need a refill on your cardiac medications before your next appointment, please call your pharmacy*  Special Instructions Please try to avoid these triggers:  Do not use any products that have nicotine or tobacco in them. These include cigarettes, e-cigarettes, and chewing tobacco. If you need help quitting, ask your doctor.  Eat heart-healthy foods.   Exercise regularly as told by your doctor.  Do not drink alcohol, Caffeine or chocolate.  Lose weight if you are overweight.  Do not use drugs, including cannabis   PLEASE INCREASE FLUID INTAKE  PLEASE READ AND FOLLOW SALTY 6-ATTACHED  Follow-Up: Your next appointment:  KEEP SCHEDULED APPOINTMENT WITH DR Martinique Please call our office 2 months in advance to schedule this appointment In Person with Peter Martinique, MD  At [facility], you and your health needs are our priority.  As part of our continuing mission to provide you with exceptional heart care, we have created designated Provider Care Teams.  These Care Teams include your primary Cardiologist (physician) and Advanced Practice Providers (APPs -  Physician Assistants and Nurse Practitioners) who all work together to provide you with the care you need, when you need it.  We recommend signing up for the patient portal called "MyChart".  Sign up information is provided on this After Visit Summary.  MyChart is used to connect with patients for Virtual Visits (Telemedicine).  Patients are able to view lab/test results, encounter notes, upcoming appointments, etc.  Non-urgent messages can be sent to your provider as well.   To learn more about what you can do with MyChart, go to [email or web address].

## 2019-10-22 NOTE — Telephone Encounter (Signed)
Left message to call back  

## 2019-10-25 ENCOUNTER — Telehealth: Payer: Self-pay | Admitting: Physician Assistant

## 2019-10-25 NOTE — Telephone Encounter (Signed)
Patient takes Eliquis for paroxysmal atrial fibrillation.  Yesterday evening he has noted multiple episode of dark brown urine he drank plenty of water and eventually her urine became clear.  However, early morning around 3 AM he has noted blood in his urine.  Otherwise he is asymptomatic.  I have reviewed with Dr. Martinique this morning.  Recommended stopping Eliquis for 3 days.  He is [place] to [place] airport to go to [place].  He will be back in town on Monday thus he cannot come for urine sampl Patient needs urology referral for Dr. Martinique.  Get urine sample when he comes back.  I have given instruction to patient.  He is expecting a call from our office and urology.

## 2019-10-28 ENCOUNTER — Telehealth: Payer: Self-pay

## 2019-10-28 ENCOUNTER — Telehealth: Payer: Self-pay | Admitting: Cardiology

## 2019-10-28 NOTE — Telephone Encounter (Signed)
I spoke to the patient and had him resume the Eliquis.  I also informed him that someone will reach out to him regarding a Urinalysis and referral to Urology.  He verbalized understanding.

## 2019-10-28 NOTE — Telephone Encounter (Signed)
Spoke to patient appointment scheduled with Dr.Wrenn 5/26 at 2:00 pm.

## 2019-10-28 NOTE — Telephone Encounter (Signed)
He needs a urinalysis even if urine cleared now. He may resume his Eliquis. He should have a referral to Urology. Vin said he sent a message to Triage so I guess that's you. Thanks  Brodan Grewell Martinique MD, [facility]

## 2019-10-28 NOTE — Telephone Encounter (Signed)
New Message  Pt stated that he had blood in his urine on Thursday and Friday. Was to stop Eliquis for a few days. Want's to know if he should start back taking Eliquis this morning  Please call

## 2019-10-28 NOTE — Telephone Encounter (Signed)
Spoke to patient see previous 5/25 telephone note.

## 2019-10-28 NOTE — Telephone Encounter (Signed)
I spoke to the patient who called to inform us that his urine was blood free on Saturday and Sunday.  He wants to know if his Eliquis should be resumed and was also wondering if a Urologist referral has been submitted.  He was told on Friday morning that a referral was to be placed for this week.

## 2019-10-28 NOTE — Telephone Encounter (Signed)
Spoke to patient stated he was told ok to restart Eliquis.Stated urine has been clear over the weekend.Advised Dr.Jordan wants him to see urology.Appointment scheduled with Dr.Wrenn 5/26 at 2:00 pm.

## 2019-10-29 ENCOUNTER — Telehealth: Payer: Self-pay | Admitting: *Deleted

## 2019-10-29 DIAGNOSIS — R400 Somnolence: Secondary | ICD-10-CM

## 2019-10-29 NOTE — Telephone Encounter (Signed)
-----   Message from Freada Bergeron, [place] sent at 10/07/2019  5:18 PM EDT ----- Regarding: RE: precert Patient refused stating he spoke to [facility] about a home sleep test not the in lab test. Thanks, Gae Bon ----- Message ----- From: Alta Corning Sent: 10/04/2019   3:14 PM EDT To: Newt Minion, RN, Freada Bergeron, CMA, # Subject: RE: precert                                    No auth required.   Thank you, Alta Corning ----- Message ----- From: Freada Bergeron, CMA Sent: 09/20/2019   5:29 PM EDT To: [facility] Subject: precert                                         ----- Message ----- From: Roland Earl Sent: 09/20/2019   5:01 PM EDT To: Newt Minion, RN, Freada Bergeron, CMA, # Subject: Sleep Study                                    Almyra Deforest, [place]

## 2019-10-29 NOTE — Addendum Note (Signed)
Addended by: Freada Bergeron on: 10/29/2019 10:47 AM   Modules accepted: Orders

## 2019-10-30 ENCOUNTER — Other Ambulatory Visit (INDEPENDENT_AMBULATORY_CARE_PROVIDER_SITE_OTHER): Payer: Medicare HMO

## 2019-10-30 DIAGNOSIS — R351 Nocturia: Secondary | ICD-10-CM | POA: Diagnosis not present

## 2019-10-30 DIAGNOSIS — I10 Essential (primary) hypertension: Secondary | ICD-10-CM | POA: Diagnosis not present

## 2019-10-30 DIAGNOSIS — R310 Gross hematuria: Secondary | ICD-10-CM | POA: Diagnosis not present

## 2019-10-30 DIAGNOSIS — N5201 Erectile dysfunction due to arterial insufficiency: Secondary | ICD-10-CM | POA: Diagnosis not present

## 2019-10-30 DIAGNOSIS — N401 Enlarged prostate with lower urinary tract symptoms: Secondary | ICD-10-CM | POA: Diagnosis not present

## 2019-11-01 ENCOUNTER — Other Ambulatory Visit: Payer: Self-pay | Admitting: Physician Assistant

## 2019-11-06 DIAGNOSIS — R310 Gross hematuria: Secondary | ICD-10-CM | POA: Diagnosis not present

## 2019-11-07 ENCOUNTER — Other Ambulatory Visit: Payer: Self-pay | Admitting: Physician Assistant

## 2019-11-11 NOTE — Telephone Encounter (Signed)
Patient is aware and agreeable to Home Sleep Study through [facility]. Patient is scheduled for 01/20/20 at 10 am to pick up home sleep kit and meet with Respiratory therapist at [facility]. Patient is aware that if this appointment date and time does not work for them they should contact Artis Delay directly at [phone]. Patient is aware that a sleep packet will be sent from [facility] in week. Patient is agreeable to treatment and thankful for call.

## 2019-12-19 NOTE — Progress Notes (Signed)
Cardiology Office Note:    Date:  12/23/2019   ID:  Jim Nguyen, DOB 12-27-1950, MRN [id number]  PCP:  Crist Infante, MD  Cardiologist:  Deven Audi Martinique, MD  Electrophysiologist:  None   Referring MD: Crist Infante, MD   Chief Complaint  Patient presents with  . Follow-up  . Atrial Fibrillation    History of Present Illness:    Jim Nguyen is a 69 y.o. male with a hx of HTN, HLD and atrial fibrillation.  Patient was previously seen by Dr. Harrell Gave in December 2020 for increased palpitation.  7 day event monitor at the time showed frequent but brief runs of PAT.  Echocardiogram was normal.  Due to persistent chest pain, he sent in some apple watch tracing that appears to show atrial fibrillation.  Diltiazem was increased to 240 mg daily.  He was seen on 07/08/2019, at which time we discussed with the patient pros and cons of anticoagulation and he preferred to avoid systemic anticoagulation for the time being. His CHA2DS2-Vasc score is 2 (age, HTN).  On 09/11/2019, he has went back into atrial fibrillation from the day before.  This was confirmed the both on EKG and his apple watch.  Given his CHA2DS2-Vasc score of 2,  Eliquis 5 mg twice daily was recommended.   His diltiazem was increased  to 360 mg daily.  Given his snoring, a home sleep study was ordered. He did later have some hematuria. He was referred to Urology for evaluation. He reports he had cystoscopy which was negative. Renal US showed a left kidney stone. Since then he states he has no further hematuria.   He is active playing golf 4x/week. Planning to start using [employer]. He has moderated his Etoh intake. He has lost weight.     Past Medical History:  Diagnosis Date  . Arthritis   . Gout   . History of gallstones   . Hypercholesterolemia   . Hypertension   . Obesity   . Tobacco user   . Vertigo     Past Surgical History:  Procedure Laterality Date  . CHOLECYSTECTOMY    . COLONOSCOPY  2007    Current  Medications: Current Meds  Medication Sig  . allopurinol (ZYLOPRIM) 300 MG tablet Take 300 mg by mouth daily.  . Ascorbic Acid (VITAMIN C PO) Take by mouth.  Marland Kitchen atorvastatin (LIPITOR) 20 MG tablet Take 20 mg by mouth daily.  . Coenzyme Q10 (CO Q 10 PO) Take by mouth.  . diltiazem (CARDIZEM CD) 360 MG 24 hr capsule TAKE 1 CAPSULE BY MOUTH EVERY DAY  . ELIQUIS 5 MG TABS tablet TAKE 1 TABLET BY MOUTH TWICE A DAY     Allergies:   Patient has no known allergies.   Social History   Socioeconomic History  . Marital status: Married    Spouse name: Not on file  . Number of children: 1  . Years of education: Not on file  . Highest education level: Not on file  Occupational History  . 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 Smoking status: Former Smoker    Packs/day: 0.50    Types: Cigars, Cigarettes    Quit date: 01/22/2016    Years since quitting: 3.9  . Smokeless tobacco: Never Used  Substance and Sexual Activity  . Alcohol use: Yes    Alcohol/week: 10.0 - 12.0 standard drinks    Types: 10 - 12 Glasses of wine per week  . Drug use: No  .  Sexual activity: Not on file  Other Topics Concern  . Not on file  Social History Narrative  . Not on file   Social Determinants of Health   Financial Resource Strain:   . Difficulty of Paying Living Expenses:   Food Insecurity:   . Worried About Charity fundraiser in the Last Year:   . Arboriculturist in the Last Year:   Transportation Needs:   . Film/video editor (Medical):   Marland Kitchen Lack of Transportation (Non-Medical):   Physical Activity:   . Days of Exercise per Week:   . Minutes of Exercise per Session:   Stress:   . Feeling of Stress :   Social Connections:   . Frequency of Communication with Friends and Family:   . Frequency of Social Gatherings with Friends and Family:   . Attends Religious Services:   . Active Member of Clubs or Organizations:   . Attends Archivist Meetings:   Marland Kitchen Marital Status:        Family History: The patient's family history includes Heart disease in his father; Pancreatic cancer in his father. There is no history of Colon cancer.  ROS:   Please see the history of present illness.     All other systems reviewed and are negative.  EKGs/Labs/Other Studies Reviewed:    The following studies were reviewed today:  Echo 04/15/2019 IMPRESSIONS    1. Left ventricular ejection fraction, by visual estimation, is 65 to  70%. The left ventricle has hyperdynamic function. There is no left  ventricular hypertrophy.  2. Left ventricular diastolic parameters are consistent with Grade I  diastolic dysfunction (impaired relaxation).  3. Global right ventricle has normal systolic function.The right  ventricular size is normal. No increase in right ventricular wall  thickness.  4. Left atrial size was mildly dilated.  5. Right atrial size was normal.  6. Mild mitral annular calcification.  7. The mitral valve is abnormal. Trace mitral valve regurgitation.  8. The tricuspid valve is normal in structure. Tricuspid valve  regurgitation is mild.  9. The aortic valve is tricuspid. Aortic valve regurgitation is not  visualized. Mild aortic valve sclerosis without stenosis.  10. The pulmonic valve was normal in structure. Pulmonic valve  regurgitation is trivial.   EKG:  EKG is not ordered today.  Recent Labs: 04/03/2019: BUN 15; Creatinine, Ser 0.87; Magnesium 2.2; Potassium 4.4; Sodium 138 09/25/2019: Hemoglobin 14.4; Platelets 174  Recent Lipid Panel    Component Value Date/Time   CHOL 125 03/29/2011 0836   TRIG 141.0 03/29/2011 0836   HDL 33.50 (L) 03/29/2011 0836   CHOLHDL 4 03/29/2011 0836   VLDL 28.2 03/29/2011 0836   LDLCALC 63 03/29/2011 0836   Dated 08/14/19: cholesterol 101, triglycerides 88, HDL 37, LDL 46. A1c 5.2%. CMET normal  Physical Exam:    VS:  BP 132/76   Pulse 74   Ht 5\' 10"  (1.778 m)   Wt 215 lb (97.5 kg)   BMI 30.85 kg/m      Wt Readings from Last 3 Encounters:  12/23/19 215 lb (97.5 kg)  10/22/19 219 lb (99.3 kg)  09/20/19 221 lb 3.2 oz (100.3 kg)     GEN:  Well nourished, well developed in no acute distress HEENT: Normal NECK: No JVD; No carotid bruits LYMPHATICS: No lymphadenopathy CARDIAC: RRR, no murmurs, rubs, gallops RESPIRATORY:  Clear to auscultation without rales, wheezing or rhonchi  ABDOMEN: Soft, non-tender, non-distended MUSCULOSKELETAL:  No edema; No deformity  SKIN: Warm and dry NEUROLOGIC:  Alert and oriented x 3 PSYCHIATRIC:  Normal affect   ASSESSMENT:    1. Paroxysmal atrial fibrillation (HCC)   2. Hypercholesteremia   3. Essential hypertension    PLAN:    In order of problems listed above:  1. PAF: He is maintaining NSR more.  He is tolerating Cardizem and Eliquis well. Negative evaluation for hematuria except for left kidney stone. Very encouraged with lifestyle modification. Plan home sleep study in August.  Follow up in 6 months.   2. Hypertension: Blood pressure stable  3. Hyperlipidemia: On Lipitor.   Medication Adjustments/Labs and Tests Ordered: Current medicines are reviewed at length with the patient today.  Concerns regarding medicines are outlined above.  No orders of the defined types were placed in this encounter.  No orders of the defined types were placed in this encounter.   There are no Patient Instructions on file for this visit.   Signed, Erna Brossard Martinique, MD  12/23/2019 10:29 AM    [facility]

## 2019-12-23 ENCOUNTER — Encounter: Payer: Self-pay | Admitting: Cardiology

## 2019-12-23 ENCOUNTER — Ambulatory Visit (INDEPENDENT_AMBULATORY_CARE_PROVIDER_SITE_OTHER): Payer: Medicare HMO | Admitting: Cardiology

## 2019-12-23 ENCOUNTER — Other Ambulatory Visit: Payer: Self-pay

## 2019-12-23 VITALS — BP 132/76 | HR 74 | Ht 70.0 in | Wt 215.0 lb

## 2019-12-23 DIAGNOSIS — I10 Essential (primary) hypertension: Secondary | ICD-10-CM

## 2019-12-23 DIAGNOSIS — I480 Paroxysmal atrial fibrillation: Secondary | ICD-10-CM

## 2019-12-23 DIAGNOSIS — E7800 Pure hypercholesterolemia, unspecified: Secondary | ICD-10-CM | POA: Diagnosis not present

## 2019-12-24 ENCOUNTER — Telehealth: Payer: Self-pay | Admitting: Cardiology

## 2019-12-24 NOTE — Telephone Encounter (Signed)
LVM for patient to return call to be scheduled with Martinique from recall list

## 2020-01-20 ENCOUNTER — Ambulatory Visit (HOSPITAL_BASED_OUTPATIENT_CLINIC_OR_DEPARTMENT_OTHER): Payer: Medicare HMO | Attending: Physician Assistant | Admitting: Cardiology

## 2020-01-20 ENCOUNTER — Other Ambulatory Visit: Payer: Self-pay

## 2020-01-20 DIAGNOSIS — R0902 Hypoxemia: Secondary | ICD-10-CM | POA: Insufficient documentation

## 2020-01-20 DIAGNOSIS — G4733 Obstructive sleep apnea (adult) (pediatric): Secondary | ICD-10-CM

## 2020-01-20 DIAGNOSIS — R400 Somnolence: Secondary | ICD-10-CM

## 2020-01-22 ENCOUNTER — Encounter (HOSPITAL_BASED_OUTPATIENT_CLINIC_OR_DEPARTMENT_OTHER): Payer: Self-pay | Admitting: Cardiology

## 2020-01-22 NOTE — Procedures (Signed)
   Patient Name: Jim Nguyen, Langille Date: 01/20/2020 Gender: Male D.O.B: [birth year] Age (years): 53 Referring Provider: Almyra Deforest PA Height (inches): 23 Interpreting Physician: Fransico Him MD, ABSM Weight (lbs): 215 RPSGT: Jacolyn Reedy BMI: 31 MRN: [id number] Neck Size: 16.00 CLINICAL INFORMATION Sleep Study Type: HST  Indication for sleep study: N/A  Epworth Sleepiness Score: N/A  SLEEP STUDY TECHNIQUE A multi-channel overnight portable sleep study was performed. The channels recorded were: nasal airflow, thoracic respiratory movement, and oxygen saturation with a pulse oximetry. Snoring was also monitored.  MEDICATIONS Patient self administered medications include: N/A.  SLEEP ARCHITECTURE Patient was studied for 430.8 minutes. The sleep efficiency was 100.0 % and the patient was supine for 0%. The arousal index was 0.0 per hour.  RESPIRATORY PARAMETERS The overall AHI was 55.6 per hour, with a central apnea index of 0.0 per hour.  The oxygen nadir was 82% during sleep.  CARDIAC DATA Mean heart rate during sleep was 56.7 bpm.  IMPRESSIONS - Severe obstructive sleep apnea occurred during this study (AHI = 55.6/h). - No significant central sleep apnea occurred during this study (CAI = 0.0/h). - Moderate oxygen desaturation was noted during this study (Min O2 = 82%). - Patient snored 11.6% during the sleep.  DIAGNOSIS - Obstructive Sleep Apnea (G47.33) - Nocturnal Hypoxemia (G47.36)  RECOMMENDATIONS - Recommend CPAP titration in lab.  - Avoid alcohol, sedatives and other CNS depressants that may worsen sleep apnea and disrupt normal sleep architecture. - Sleep hygiene should be reviewed to assess factors that may improve sleep quality. - Weight management and regular exercise should be initiated or continued.  [Electronically signed] 01/22/2020 12:41 PM  Fransico Him MD, ABSM Diplomate, American Board of Sleep Medicine

## 2020-01-27 ENCOUNTER — Telehealth: Payer: Self-pay

## 2020-01-27 DIAGNOSIS — G4733 Obstructive sleep apnea (adult) (pediatric): Secondary | ICD-10-CM

## 2020-01-27 DIAGNOSIS — R400 Somnolence: Secondary | ICD-10-CM

## 2020-01-27 NOTE — Telephone Encounter (Signed)
Called patient to give results and recommendations. Pt informed me he already reviewed the results on mychart but was appreciative for the call because he was unsure of how to proceed. Pt is aware and agreeable to in lab CPAP Titration upon precert and approval  Order in and sent to precert

## 2020-01-27 NOTE — Telephone Encounter (Signed)
-----   Message from Sueanne Margarita, MD sent at 01/22/2020 12:44 PM EDT ----- Please let patient know that they have sleep apnea and recommend CPAP titration. Please set up titration in the sleep lab.

## 2020-01-29 ENCOUNTER — Other Ambulatory Visit: Payer: Self-pay | Admitting: Cardiology

## 2020-01-29 DIAGNOSIS — R310 Gross hematuria: Secondary | ICD-10-CM | POA: Diagnosis not present

## 2020-01-29 DIAGNOSIS — N5201 Erectile dysfunction due to arterial insufficiency: Secondary | ICD-10-CM | POA: Diagnosis not present

## 2020-01-29 DIAGNOSIS — R351 Nocturia: Secondary | ICD-10-CM | POA: Diagnosis not present

## 2020-01-29 DIAGNOSIS — N401 Enlarged prostate with lower urinary tract symptoms: Secondary | ICD-10-CM | POA: Diagnosis not present

## 2020-02-03 NOTE — Telephone Encounter (Signed)
Faxed Clinical information to [employer] at [phone] Case ID [id number]

## 2020-02-04 DIAGNOSIS — R69 Illness, unspecified: Secondary | ICD-10-CM | POA: Diagnosis not present

## 2020-02-04 NOTE — Telephone Encounter (Signed)
Received request via Fax from [employer] [facility]) for recent office visit note discussing current Mallampati grade, sleep questionnaire ([place] or STOP [place]) and pertinent medical history related to requested order.   Sent staff message to Almyra Deforest, PA-C to add STOP [place] assessment to note so that it may be sent for review for CPAP Titration.

## 2020-02-05 ENCOUNTER — Telehealth: Payer: Self-pay | Admitting: *Deleted

## 2020-02-05 NOTE — Telephone Encounter (Signed)
Received request via Fax from [employer] [facility]) for recent office visit note discussing current Mallampati grade, sleep questionnaire ([place] or STOP [place]) and pertinent medical history related to requested order.   Reached out to [employer] Vaughan Basta). Vaughan Basta states the the approval for the titration is pending and they need more clinicals sent. Office notes and epworth sleepiness score have been sent sent to [employer] 02-05-20.

## 2020-02-06 NOTE — Telephone Encounter (Addendum)
02/05/20 received auth from [employer] for cpap titration approval Auth# [id number]  auth dates 02/05/20- 08/03/20 Reference #[id number]

## 2020-03-11 ENCOUNTER — Ambulatory Visit (HOSPITAL_BASED_OUTPATIENT_CLINIC_OR_DEPARTMENT_OTHER): Payer: Medicare HMO | Attending: Cardiology | Admitting: Cardiology

## 2020-03-11 ENCOUNTER — Other Ambulatory Visit: Payer: Self-pay

## 2020-03-11 DIAGNOSIS — R400 Somnolence: Secondary | ICD-10-CM

## 2020-03-11 DIAGNOSIS — G4733 Obstructive sleep apnea (adult) (pediatric): Secondary | ICD-10-CM | POA: Insufficient documentation

## 2020-03-11 DIAGNOSIS — Z9989 Dependence on other enabling machines and devices: Secondary | ICD-10-CM | POA: Diagnosis not present

## 2020-03-22 NOTE — Procedures (Signed)
   Patient Name: Jim Nguyen, Jim Nguyen Date: 03/11/2020 Gender: Male D.O.B: June 30, 1950 Age (years): 28 Referring Provider: Almyra Deforest PA Height (inches): 1 Interpreting Physician: Fransico Him MD, ABSM Weight (lbs): 215 RPSGT: Zadie Rhine BMI: 31 MRN: [id number] Neck Size: 17.00  CLINICAL INFORMATION The patient is referred for a CPAP titration to treat sleep apnea.  SLEEP STUDY TECHNIQUE As per the AASM Manual for the Scoring of Sleep and Associated Events v2.3 (April 2016) with a hypopnea requiring 4% desaturations.  The channels recorded and monitored were frontal, central and occipital EEG, electrooculogram (EOG), submentalis EMG (chin), nasal and oral airflow, thoracic and abdominal wall motion, anterior tibialis EMG, snore microphone, electrocardiogram, and pulse oximetry. Continuous positive airway pressure (CPAP) was initiated at the beginning of the study and titrated to treat sleep-disordered breathing.  MEDICATIONS Medications self-administered by patient taken the night of the study : N/A  TECHNICIAN COMMENTS Comments added by technician: [place]. Patient had difficulty initiating sleep. Comments added by scorer: N/A  RESPIRATORY PARAMETERS Optimal PAP Pressure (cm): 7  AHI at Optimal Pressure (/hr):0.0 Overall Minimal O2 (%):88.0  Supine % at Optimal Pressure (%):100 Minimal O2 at Optimal Pressure (%): 90.0   SLEEP ARCHITECTURE The study was initiated at 10:53:25 PM and ended at 4:49:47 AM.  Sleep onset time was 23.2 minutes and the sleep efficiency was 77.6%. The total sleep time was 276.5 minutes.  The patient spent 7.6% of the night in stage N1 sleep, 57.7% in stage N2 sleep, 1.8% in stage N3 and 32.9% in REM.Stage REM latency was 60.0 minutes  Wake after sleep onset was 56.7. Alpha intrusion was absent. Supine sleep was 100.00%.  CARDIAC DATA The 2 lead EKG demonstrated sinus rhythm. The mean heart rate was 56.0 beats per minute. Other EKG  findings include: None.  LEG MOVEMENT DATA The total Periodic Limb Movements of Sleep (PLMS) were 0. The PLMS index was 0.0. A PLMS index of <15 is considered normal in adults.  IMPRESSIONS - The optimal PAP pressure was 7 cm of water. - Central sleep apnea was not noted during this titration (CAI = 0.0/h). - Mild oxygen desaturations were observed during this titration (min O2 = 88.0%). - No snoring was audible during this study. - No cardiac abnormalities were observed during this study. - Clinically significant periodic limb movements were not noted during this study. Arousals associated with PLMs were rare.  DIAGNOSIS - Obstructive Sleep Apnea (G47.33)  RECOMMENDATIONS - Trial of CPAP therapy on 7 cm H2O with a Medium size Philips Respironics Nasal Pillow Mask Nuance Pro Gel mask and heated humidification. - Avoid alcohol, sedatives and other CNS depressants that may worsen sleep apnea and disrupt normal sleep architecture. - Sleep hygiene should be reviewed to assess factors that may improve sleep quality. - Weight management and regular exercise should be initiated or continued. - Return to Sleep Center for re-evaluation after 8 weeks of therapy  [Electronically signed] 03/22/2020 01:36 PM  Fransico Him MD, ABSM Diplomate, American Board of Sleep Medicine

## 2020-03-23 ENCOUNTER — Telehealth: Payer: Self-pay | Admitting: *Deleted

## 2020-03-23 NOTE — Telephone Encounter (Signed)
-----   Message from Sueanne Margarita, MD sent at 03/22/2020  1:44 PM EDT ----- Please let patient know that they had a successful PAP titration and let DME know that orders are in EPIC.  Please set up 8 week OV with me.

## 2020-03-23 NOTE — Telephone Encounter (Signed)
Informed patient of sleep study results and patient understanding was verbalized. °Patient understands his sleep study showed they had a successful PAP titration and let DME know that orders are in EPIC.  Please set up 8 week OV with me.  ° °Upon patient request DME selection is [facility]. °Patient understands he will be contacted by [facility] to set up his cpap. °Patient understands to call if [facility] does not contact him with new setup in a timely manner. °Patient understands they will be called once confirmation has been received from [facility] that they have received their new machine to schedule 10 week follow up appointment. ° °[facility] notified of new cpap order  °Please add to airview °Patient was grateful for the call and thanked me. ° ° ° ° ° ° ° ° ° ° ° ° ° ° ° ° °

## 2020-03-24 DIAGNOSIS — R69 Illness, unspecified: Secondary | ICD-10-CM | POA: Diagnosis not present

## 2020-04-01 ENCOUNTER — Other Ambulatory Visit: Payer: Self-pay | Admitting: Physician Assistant

## 2020-04-06 ENCOUNTER — Telehealth: Payer: Self-pay | Admitting: Cardiology

## 2020-04-06 ENCOUNTER — Other Ambulatory Visit: Payer: Self-pay

## 2020-04-06 ENCOUNTER — Encounter (HOSPITAL_COMMUNITY): Payer: Self-pay | Admitting: Physician Assistant

## 2020-04-06 ENCOUNTER — Ambulatory Visit (HOSPITAL_COMMUNITY)
Admission: RE | Admit: 2020-04-06 | Discharge: 2020-04-06 | Disposition: A | Discharge status: Home / Self Care | Payer: Medicare HMO | Source: Ambulatory Visit | Attending: Physician Assistant | Admitting: Physician Assistant

## 2020-04-06 VITALS — BP 114/74 | HR 110 | Ht 70.0 in | Wt 218.4 lb

## 2020-04-06 DIAGNOSIS — I10 Essential (primary) hypertension: Secondary | ICD-10-CM | POA: Insufficient documentation

## 2020-04-06 DIAGNOSIS — Z7901 Long term (current) use of anticoagulants: Secondary | ICD-10-CM | POA: Diagnosis not present

## 2020-04-06 DIAGNOSIS — G4733 Obstructive sleep apnea (adult) (pediatric): Secondary | ICD-10-CM | POA: Diagnosis not present

## 2020-04-06 DIAGNOSIS — Z8249 Family history of ischemic heart disease and other diseases of the circulatory system: Secondary | ICD-10-CM | POA: Insufficient documentation

## 2020-04-06 DIAGNOSIS — Z6831 Body mass index (BMI) 31.0-31.9, adult: Secondary | ICD-10-CM | POA: Diagnosis not present

## 2020-04-06 DIAGNOSIS — E669 Obesity, unspecified: Secondary | ICD-10-CM | POA: Insufficient documentation

## 2020-04-06 DIAGNOSIS — Z87891 Personal history of nicotine dependence: Secondary | ICD-10-CM | POA: Diagnosis not present

## 2020-04-06 DIAGNOSIS — Z79899 Other long term (current) drug therapy: Secondary | ICD-10-CM | POA: Insufficient documentation

## 2020-04-06 DIAGNOSIS — I480 Paroxysmal atrial fibrillation: Secondary | ICD-10-CM | POA: Insufficient documentation

## 2020-04-06 DIAGNOSIS — D6869 Other thrombophilia: Secondary | ICD-10-CM | POA: Insufficient documentation

## 2020-04-06 MED ORDER — METOPROLOL TARTRATE 25 MG PO TABS: 12.5000 mg | ORAL_TABLET | Freq: Two times a day (BID) | ORAL | 0 refills | Status: DC

## 2020-04-06 NOTE — Progress Notes (Signed)
Primary Care Physician: Crist Infante, MD Primary Cardiologist: Dr Martinique Primary Electrophysiologist: none Referring Physician: Dr Charlett Lango Jim Nguyen is a 69 y.o. male with a history of HTN, HLD, paroxysmal atrial fibrillation, atrial tachycardia, OSA, who presents for consultation in the [facility]. The patient was initially diagnosed with atrial fibrillation 06/2019 after presenting with symptoms of palpitations. He wore an event monitor which showed PAT but he did have Apple Watch strips which showed rate controlled afib (reviewed by Dr Martinique see phone note 06/11/19). Patient is on Eliquis for a CHADS2VASC score of 2. He has had periodic episodes of palpitations over the last several months which have all spontaneously resolved. He felt he was back in afib with labile heart rates and fatigue on 04/02/20. Patient admits he has been under considerable stress recently with family stressors. He denies any bleeding issues on anticoagulation. Of note, he is leaving this Friday to go out of town for one week.   Today, he denies symptoms of palpitations, chest pain, shortness of breath, orthopnea, PND, lower extremity edema, dizziness, presyncope, syncope, bleeding, or neurologic sequela. The patient is tolerating medications without difficulties and is otherwise without complaint today.    Atrial Fibrillation Risk Factors:  he does have symptoms or diagnosis of sleep apnea. Patient is scheduled to pick up CPAP later this week. he does not have a history of rheumatic fever. he does have a history of alcohol use. The patient does not have a history of early familial atrial fibrillation or other arrhythmias.  he has a BMI of Body mass index is 31.34 kg/m.Marland Kitchen Filed Weights   04/06/20 1459  Weight: 99.1 kg    Family History  Problem Relation Age of Onset  . Heart disease Father        CABG  . Pancreatic cancer Father   . Colon cancer Neg Hx      Atrial  Fibrillation Management history:  Previous antiarrhythmic drugs: none Previous cardioversions: none Previous ablations: none CHADS2VASC score: 2 Anticoagulation history: Eliquis    Past Medical History:  Diagnosis Date  . Arthritis   . Gout   . History of gallstones   . Hypercholesterolemia   . Hypertension   . Obesity   . Tobacco user   . Vertigo    Past Surgical History:  Procedure Laterality Date  . CHOLECYSTECTOMY    . COLONOSCOPY  2007    Current Outpatient Medications  Medication Sig Dispense Refill  . allopurinol (ZYLOPRIM) 300 MG tablet Take 300 mg by mouth daily.    . Ascorbic Acid (VITAMIN C PO) Take by mouth.    Marland Kitchen atorvastatin (LIPITOR) 20 MG tablet Take 20 mg by mouth daily.    . Coenzyme Q10 (CO Q 10 PO) Take by mouth.    . diltiazem (CARDIZEM CD) 360 MG 24 hr capsule TAKE 1 CAPSULE BY MOUTH EVERY DAY 90 capsule 2  . ELIQUIS 5 MG TABS tablet TAKE 1 TABLET BY MOUTH TWICE A DAY 180 tablet 1   No current facility-administered medications for this encounter.    No Known Allergies  Social History   Socioeconomic History  . Marital status: Married    Spouse name: Not on file  . Number of children: 1  . Years of education: Not on file  . Highest education level: Not on file  Occupational History  . 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 Smoking status: Former Smoker    Packs/day:  0.50    Types: Cigars, Cigarettes    Quit date: 01/22/2016    Years since quitting: 4.2  . Smokeless tobacco: Never Used  Substance and Sexual Activity  . Alcohol use: Yes    Alcohol/week: 10.0 - 12.0 standard drinks    Types: 10 - 12 Glasses of wine per week  . Drug use: No  . Sexual activity: Not on file  Other Topics Concern  . Not on file  Social History Narrative  . Not on file   Social Determinants of Health   Financial Resource Strain:   . Difficulty of Paying Living Expenses: Not on file  Food Insecurity:   . Worried About Charity fundraiser  in the Last Year: Not on file  . Ran Out of Food in the Last Year: Not on file  Transportation Needs:   . Lack of Transportation (Medical): Not on file  . Lack of Transportation (Non-Medical): Not on file  Physical Activity:   . Days of Exercise per Week: Not on file  . Minutes of Exercise per Session: Not on file  Stress:   . Feeling of Stress : Not on file  Social Connections:   . Frequency of Communication with Friends and Family: Not on file  . Frequency of Social Gatherings with Friends and Family: Not on file  . Attends Religious Services: Not on file  . Active Member of Clubs or Organizations: Not on file  . Attends Archivist Meetings: Not on file  . Marital Status: Not on file  Intimate Partner Violence:   . Fear of Current or Ex-Partner: Not on file  . Emotionally Abused: Not on file  . Physically Abused: Not on file  . Sexually Abused: Not on file     ROS- All systems are reviewed and negative except as per the HPI above.  Physical Exam: Vitals:   04/06/20 1459  BP: 114/74  Pulse: (!) 110  Weight: 99.1 kg  Height: 5\' 10"  (1.778 m)    GEN- The patient is well appearing obese male, alert and oriented x 3 today.   Head- normocephalic, atraumatic Eyes-  Sclera clear, conjunctiva pink Ears- hearing intact Oropharynx- clear Neck- supple  Lungs- Clear to ausculation bilaterally, normal work of breathing Heart- irregular rate and rhythm, no murmurs, rubs or gallops  GI- soft, NT, ND, + BS Extremities- no clubbing, cyanosis, or edema MS- no significant deformity or atrophy Skin- no rash or lesion Psych- euthymic mood, full affect Neuro- strength and sensation are intact  Wt Readings from Last 3 Encounters:  04/06/20 99.1 kg  03/11/20 97.5 kg  01/20/20 97.5 kg    EKG today demonstrates afib HR 110, QRS 94, QTc 457  Echo 04/15/19 demonstrated  1. Left ventricular ejection fraction, by visual estimation, is 65 to  70%. The left ventricle has  hyperdynamic function. There is no left  ventricular hypertrophy.  2. Left ventricular diastolic parameters are consistent with Grade I  diastolic dysfunction (impaired relaxation).  3. Global right ventricle has normal systolic function.The right  ventricular size is normal. No increase in right ventricular wall  thickness.  4. Left atrial size was mildly dilated.  5. Right atrial size was normal.  6. Mild mitral annular calcification.  7. The mitral valve is abnormal. Trace mitral valve regurgitation.  8. The tricuspid valve is normal in structure. Tricuspid valve  regurgitation is mild.  9. The aortic valve is tricuspid. Aortic valve regurgitation is not  visualized. Mild aortic valve  sclerosis without stenosis.  10. The pulmonic valve was normal in structure. Pulmonic valve  regurgitation is trivial.   Epic records are reviewed at length today  CHA2DS2-VASc Score = 2  The patient's score is based upon: CHF History: 0 HTN History: 1 Diabetes History: 0 Stroke History: 0 Vascular Disease History: 0 Age Score: 1 Gender Score: 0      ASSESSMENT AND PLAN: 1. Paroxysmal Atrial Fibrillation (ICD10:  I48.0) The patient's CHA2DS2-VASc score is 2, indicating a 2.2% annual risk of stroke.   General education about afib provided and questions answered.  We discussed therapeutic options today including DCCV, AAD, and ablation.  For now, patient would like avoid DCCV if possible as all of his previous episodes have spontaneously resolved. Will start Lopressor 12.5 mg BID. Patient to discontinue if/when he returns to [place]. Continue diltiazem 360 mg daily Continue Eliquis 5 mg BID. Patient denies any missed doses in the last 3 weeks. Will consider DCCV if he remains in afib persistently for the next several days.  2. Secondary Hypercoagulable State (ICD10:  D68.69) The patient is at significant risk for stroke/thromboembolism based upon his CHA2DS2-VASc Score of 2.  Continue  Apixaban (Eliquis).   3. Obesity Body mass index is 31.34 kg/m. Lifestyle modification was discussed at length including regular exercise and weight reduction.  4. Obstructive sleep apnea The importance of adequate treatment of sleep apnea was discussed today in order to improve our ability to maintain sinus rhythm long term. Patient to pick up CPAP later this week.  5. HTN Stable, no changes today.   Follow up in the AF clinic in 2 weeks.    [facility] [address] [place],  [address] [phone] 04/06/2020 3:13 PM

## 2020-04-06 NOTE — Telephone Encounter (Signed)
Having afib episode that started on Thursday per his Apple watch. Is currently driving back to [place] from [place]. HR runs 60-65 during the day normally. HR is going between 84 and 113-114 at rest. Is supposed to leave Friday for a trip but states he does not feel comfortable doing so unless his HR becomes controlled again.  Pt states he hasn't had an afib episode in 3 months. Denies use of caffeine. No chest pain or shortness of breath.   As an aside, pt mentions that he had a sleep study done 4 weeks ago and is picking up his CPAP on Wednesday.   Pt states he has taken all of his medications, including diltiazem and eliquis, as directed.   Appointment made for pt at [facility] today at 3 p.m. with [facility]. Called to make pt aware. Pt verbalizes understanding and agrees with plan.

## 2020-04-06 NOTE — Patient Instructions (Signed)
Take Metoprolol 25mg  tablet- Take 1/2 tablet by mouth twice daily He can STOP this medication if he ends up going back into normal sinus rhythm

## 2020-04-06 NOTE — Telephone Encounter (Signed)
Patient c/o Palpitations:  High priority if patient c/o lightheadedness, shortness of breath, or chest pain  1) How long have you had palpitations/irregular HR/ Afib? Are you having the symptoms now? Afib- patient assumes he in in afib right now   2) Are you currently experiencing lightheadedness, SOB or CP? No   3) Do you have a history of afib (atrial fibrillation) or irregular heart rhythm? Yes   4) Have you checked your BP or HR? (document readings if available): no readings available, however he states his HR has been fluctuating 80-120  5) Are you experiencing any other symptoms? No

## 2020-04-08 DIAGNOSIS — G4733 Obstructive sleep apnea (adult) (pediatric): Secondary | ICD-10-CM | POA: Diagnosis not present

## 2020-04-08 NOTE — Telephone Encounter (Signed)
Agree with plan 

## 2020-04-20 ENCOUNTER — Other Ambulatory Visit: Payer: Self-pay

## 2020-04-20 ENCOUNTER — Encounter (HOSPITAL_COMMUNITY): Payer: Self-pay | Admitting: Physician Assistant

## 2020-04-20 ENCOUNTER — Ambulatory Visit (HOSPITAL_COMMUNITY)
Admission: RE | Admit: 2020-04-20 | Discharge: 2020-04-20 | Disposition: A | Discharge status: Home / Self Care | Payer: Medicare HMO | Source: Ambulatory Visit | Attending: Physician Assistant | Admitting: Physician Assistant

## 2020-04-20 VITALS — BP 132/68 | HR 62 | Ht 70.0 in | Wt 222.0 lb

## 2020-04-20 DIAGNOSIS — Z6831 Body mass index (BMI) 31.0-31.9, adult: Secondary | ICD-10-CM | POA: Diagnosis not present

## 2020-04-20 DIAGNOSIS — Z7901 Long term (current) use of anticoagulants: Secondary | ICD-10-CM | POA: Insufficient documentation

## 2020-04-20 DIAGNOSIS — D6869 Other thrombophilia: Secondary | ICD-10-CM | POA: Diagnosis not present

## 2020-04-20 DIAGNOSIS — Z79899 Other long term (current) drug therapy: Secondary | ICD-10-CM | POA: Insufficient documentation

## 2020-04-20 DIAGNOSIS — I10 Essential (primary) hypertension: Secondary | ICD-10-CM | POA: Diagnosis not present

## 2020-04-20 DIAGNOSIS — Z87891 Personal history of nicotine dependence: Secondary | ICD-10-CM | POA: Insufficient documentation

## 2020-04-20 DIAGNOSIS — E669 Obesity, unspecified: Secondary | ICD-10-CM | POA: Insufficient documentation

## 2020-04-20 DIAGNOSIS — I480 Paroxysmal atrial fibrillation: Secondary | ICD-10-CM | POA: Insufficient documentation

## 2020-04-20 DIAGNOSIS — G4733 Obstructive sleep apnea (adult) (pediatric): Secondary | ICD-10-CM | POA: Insufficient documentation

## 2020-04-20 NOTE — Progress Notes (Signed)
Primary Care Physician: Crist Infante, MD Primary Cardiologist: Dr Martinique Primary Electrophysiologist: none Referring Physician: Dr Charlett Lango Jim Nguyen is a 69 y.o. male with a history of HTN, HLD, paroxysmal atrial fibrillation, atrial tachycardia, OSA, who presents for follow up in the [facility]. The patient was initially diagnosed with atrial fibrillation 06/2019 after presenting with symptoms of palpitations. He wore an event monitor which showed PAT but he did have Apple Watch strips which showed rate controlled afib (reviewed by Dr Martinique see phone note 06/11/19). Patient is on Eliquis for a CHADS2VASC score of 2. He has had periodic episodes of palpitations over the last several months which have all spontaneously resolved. He felt he was back in afib with labile heart rates and fatigue on 04/02/20. Patient admits he has been under considerable stress recently with family stressors. He denies any bleeding issues on anticoagulation.   On follow up today, patient reports he converted to [place] after his last appointment later that day. He has started using his CPAP with good results. He is sleeping ~8 hours with the device. He has not had any further heart racing symptoms.   Today, he denies symptoms of palpitations, chest pain, shortness of breath, orthopnea, PND, lower extremity edema, dizziness, presyncope, syncope, bleeding, or neurologic sequela. The patient is tolerating medications without difficulties and is otherwise without complaint today.    Atrial Fibrillation Risk Factors:  he does have symptoms or diagnosis of sleep apnea. Patient is scheduled to pick up CPAP later this week. he does not have a history of rheumatic fever. he does have a history of alcohol use. The patient does not have a history of early familial atrial fibrillation or other arrhythmias.  he has a BMI of Body mass index is 31.85 kg/m.Marland Kitchen Filed Weights   04/20/20 1033   Weight: 100.7 kg    Family History  Problem Relation Age of Onset  . Heart disease Father        CABG  . Pancreatic cancer Father   . Colon cancer Neg Hx      Atrial Fibrillation Management history:  Previous antiarrhythmic drugs: none Previous cardioversions: none Previous ablations: none CHADS2VASC score: 2 Anticoagulation history: Eliquis    Past Medical History:  Diagnosis Date  . Arthritis   . Gout   . History of gallstones   . Hypercholesterolemia   . Hypertension   . Obesity   . Tobacco user   . Vertigo    Past Surgical History:  Procedure Laterality Date  . CHOLECYSTECTOMY    . COLONOSCOPY  2007    Current Outpatient Medications  Medication Sig Dispense Refill  . allopurinol (ZYLOPRIM) 300 MG tablet Take 300 mg by mouth daily.    . Ascorbic Acid (VITAMIN C PO) Take by mouth.    Marland Kitchen atorvastatin (LIPITOR) 20 MG tablet Take 20 mg by mouth daily.    . Coenzyme Q10 (CO Q 10 PO) Take by mouth.    . diltiazem (CARDIZEM CD) 360 MG 24 hr capsule TAKE 1 CAPSULE BY MOUTH EVERY DAY 90 capsule 2  . ELIQUIS 5 MG TABS tablet TAKE 1 TABLET BY MOUTH TWICE A DAY 180 tablet 1  . metoprolol tartrate (LOPRESSOR) 25 MG tablet Take 0.5 tablets (12.5 mg total) by mouth 2 (two) times daily. 30 tablet 0   No current facility-administered medications for this encounter.    No Known Allergies  Social History   Socioeconomic History  . Marital status:  Married    Spouse name: Not on file  . Number of children: 1  . Years of education: Not on file  . Highest education level: Not on file  Occupational History  . 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 Smoking status: Former Smoker    Packs/day: 0.50    Types: Cigars, Cigarettes    Quit date: 01/22/2016    Years since quitting: 4.2  . Smokeless tobacco: Never Used  Substance and Sexual Activity  . Alcohol use: Yes    Alcohol/week: 10.0 - 12.0 standard drinks    Types: 10 - 12 Glasses of wine per week  .  Drug use: No  . Sexual activity: Not on file  Other Topics Concern  . Not on file  Social History Narrative  . Not on file   Social Determinants of Health   Financial Resource Strain:   . Difficulty of Paying Living Expenses: Not on file  Food Insecurity:   . Worried About Charity fundraiser in the Last Year: Not on file  . Ran Out of Food in the Last Year: Not on file  Transportation Needs:   . Lack of Transportation (Medical): Not on file  . Lack of Transportation (Non-Medical): Not on file  Physical Activity:   . Days of Exercise per Week: Not on file  . Minutes of Exercise per Session: Not on file  Stress:   . Feeling of Stress : Not on file  Social Connections:   . Frequency of Communication with Friends and Family: Not on file  . Frequency of Social Gatherings with Friends and Family: Not on file  . Attends Religious Services: Not on file  . Active Member of Clubs or Organizations: Not on file  . Attends Archivist Meetings: Not on file  . Marital Status: Not on file  Intimate Partner Violence:   . Fear of Current or Ex-Partner: Not on file  . Emotionally Abused: Not on file  . Physically Abused: Not on file  . Sexually Abused: Not on file     ROS- All systems are reviewed and negative except as per the HPI above.  Physical Exam: Vitals:   04/20/20 1033  BP: 132/68  Pulse: 62  Weight: 100.7 kg  Height: 5\' 10"  (1.778 m)    GEN- The patient is well appearing obese male, alert and oriented x 3 today.   HEENT-head normocephalic, atraumatic, sclera clear, conjunctiva pink, hearing intact, trachea midline. Lungs- Clear to ausculation bilaterally, normal work of breathing Heart- Regular rate and rhythm, no murmurs, rubs or gallops  GI- soft, NT, ND, + BS Extremities- no clubbing, cyanosis, or edema MS- no significant deformity or atrophy Skin- no rash or lesion Psych- euthymic mood, full affect Neuro- strength and sensation are intact   Wt  Readings from Last 3 Encounters:  04/20/20 100.7 kg  04/06/20 99.1 kg  03/11/20 97.5 kg    EKG today demonstrates SR HR 62, PR 166, QRS 88, QTc 414  Echo 04/15/19 demonstrated  1. Left ventricular ejection fraction, by visual estimation, is 65 to  70%. The left ventricle has hyperdynamic function. There is no left  ventricular hypertrophy.  2. Left ventricular diastolic parameters are consistent with Grade I  diastolic dysfunction (impaired relaxation).  3. Global right ventricle has normal systolic function.The right  ventricular size is normal. No increase in right ventricular wall  thickness.  4. Left atrial size was mildly dilated.  5. Right atrial size was  normal.  6. Mild mitral annular calcification.  7. The mitral valve is abnormal. Trace mitral valve regurgitation.  8. The tricuspid valve is normal in structure. Tricuspid valve  regurgitation is mild.  9. The aortic valve is tricuspid. Aortic valve regurgitation is not  visualized. Mild aortic valve sclerosis without stenosis.  10. The pulmonic valve was normal in structure. Pulmonic valve  regurgitation is trivial.   Epic records are reviewed at length today  CHA2DS2-VASc Score = 2  The patient's score is based upon: CHF History: 0 HTN History: 1 Diabetes History: 0 Stroke History: 0 Vascular Disease History: 0 Age Score: 1 Gender Score: 0      ASSESSMENT AND PLAN: 1. Paroxysmal Atrial Fibrillation (ICD10:  I48.0) The patient's CHA2DS2-VASc score is 2, indicating a 2.2% annual risk of stroke.   Patient converted to SR spontaneously.  Patient now on CPAP therapy. Will hold on AAD for now. Would consider flecainide, Multaq, or ablation if patient has frequent episodes of afib. Continue Lopressor PRN 12.5 mg BID Continue diltiazem 360 mg daily Continue Eliquis 5 mg BID  2. Secondary Hypercoagulable State (ICD10:  D68.69) The patient is at significant risk for stroke/thromboembolism based upon his  CHA2DS2-VASc Score of 2.  Continue Apixaban (Eliquis).   3. Obesity Body mass index is 31.85 kg/m. Lifestyle modification was discussed and encouraged including regular physical activity and weight reduction.  4. Obstructive sleep apnea Patient reports compliance with CPAP therapy.   5. HTN Stable, no changes today.   Follow up with Dr Martinique per recall. AF clinic in 6 months.    [facility] [address] [place], [place] [address] [phone] 04/20/2020 11:08 AM

## 2020-04-27 NOTE — Telephone Encounter (Signed)
Patient has a 10 week follow up appointment scheduled for 06/09/19. Patient understands he needs to keep this appointment for insurance compliance. Left detailed message on voicemail and informed patient to call back with questions.

## 2020-05-05 NOTE — Telephone Encounter (Signed)
Pt is aware and agreeable to appointment time.

## 2020-05-08 DIAGNOSIS — G4733 Obstructive sleep apnea (adult) (pediatric): Secondary | ICD-10-CM | POA: Diagnosis not present

## 2020-05-18 DIAGNOSIS — D2272 Melanocytic nevi of left lower limb, including hip: Secondary | ICD-10-CM | POA: Diagnosis not present

## 2020-05-18 DIAGNOSIS — I788 Other diseases of capillaries: Secondary | ICD-10-CM | POA: Diagnosis not present

## 2020-05-18 DIAGNOSIS — D225 Melanocytic nevi of trunk: Secondary | ICD-10-CM | POA: Diagnosis not present

## 2020-05-18 DIAGNOSIS — L821 Other seborrheic keratosis: Secondary | ICD-10-CM | POA: Diagnosis not present

## 2020-05-18 DIAGNOSIS — L812 Freckles: Secondary | ICD-10-CM | POA: Diagnosis not present

## 2020-05-18 DIAGNOSIS — D2261 Melanocytic nevi of right upper limb, including shoulder: Secondary | ICD-10-CM | POA: Diagnosis not present

## 2020-05-18 DIAGNOSIS — D2271 Melanocytic nevi of right lower limb, including hip: Secondary | ICD-10-CM | POA: Diagnosis not present

## 2020-05-18 DIAGNOSIS — L565 Disseminated superficial actinic porokeratosis (DSAP): Secondary | ICD-10-CM | POA: Diagnosis not present

## 2020-05-18 DIAGNOSIS — D2262 Melanocytic nevi of left upper limb, including shoulder: Secondary | ICD-10-CM | POA: Diagnosis not present

## 2020-06-04 ENCOUNTER — Telehealth: Payer: Self-pay

## 2020-06-04 NOTE — Telephone Encounter (Signed)
°  Patient Consent for Virtual Visit         Jim Nguyen has provided verbal consent on 06/04/2020 for a virtual visit (video or telephone).   CONSENT FOR VIRTUAL VISIT FOR:  Jim Nguyen  By participating in this virtual visit I agree to the following:  I hereby voluntarily request, consent and authorize [facility] and its employed or contracted physicians, physician assistants, nurse practitioners or other licensed health care professionals (the Practitioner), to provide me with telemedicine health care services (the Services") as deemed necessary by the treating Practitioner. I acknowledge and consent to receive the Services by the Practitioner via telemedicine. I understand that the telemedicine visit will involve communicating with the Practitioner through live audiovisual communication technology and the disclosure of certain medical information by electronic transmission. I acknowledge that I have been given the opportunity to request an in-person assessment or other available alternative prior to the telemedicine visit and am voluntarily participating in the telemedicine visit.  I understand that I have the right to withhold or withdraw my consent to the use of telemedicine in the course of my care at any time, without affecting my right to future care or treatment, and that the Practitioner or I may terminate the telemedicine visit at any time. I understand that I have the right to inspect all information obtained and/or recorded in the course of the telemedicine visit and may receive copies of available information for a reasonable fee.  I understand that some of the potential risks of receiving the Services via telemedicine include:   Delay or interruption in medical evaluation due to technological equipment failure or disruption;  Information transmitted may not be sufficient (e.g. poor resolution of images) to allow for appropriate medical decision making by the  Practitioner; and/or   In rare instances, security protocols could fail, causing a breach of personal health information.  Furthermore, I acknowledge that it is my responsibility to provide information about my medical history, conditions and care that is complete and accurate to the best of my ability. I acknowledge that Practitioner's advice, recommendations, and/or decision may be based on factors not within their control, such as incomplete or inaccurate data provided by me or distortions of diagnostic images or specimens that may result from electronic transmissions. I understand that the practice of medicine is not an exact science and that Practitioner makes no warranties or guarantees regarding treatment outcomes. I acknowledge that a copy of this consent can be made available to me via my patient portal [facility] MyChart), or I can request a printed copy by calling the office of [facility].    I understand that my insurance will be billed for this visit.   I have read or had this consent read to me.  I understand the contents of this consent, which adequately explains the benefits and risks of the Services being provided via telemedicine.   I have been provided ample opportunity to ask questions regarding this consent and the Services and have had my questions answered to my satisfaction.  I give my informed consent for the services to be provided through the use of telemedicine in my medical care

## 2020-06-08 ENCOUNTER — Ambulatory Visit (INDEPENDENT_AMBULATORY_CARE_PROVIDER_SITE_OTHER): Payer: Medicare HMO | Admitting: Cardiology

## 2020-06-08 ENCOUNTER — Other Ambulatory Visit: Payer: Self-pay

## 2020-06-08 ENCOUNTER — Encounter: Payer: Medicare HMO | Admitting: Cardiology

## 2020-06-08 ENCOUNTER — Encounter: Payer: Self-pay | Admitting: Cardiology

## 2020-06-08 VITALS — Ht 70.0 in | Wt 215.0 lb

## 2020-06-08 VITALS — BP 142/70 | HR 63 | Ht 70.0 in | Wt 221.2 lb

## 2020-06-08 DIAGNOSIS — I10 Essential (primary) hypertension: Secondary | ICD-10-CM | POA: Diagnosis not present

## 2020-06-08 DIAGNOSIS — G4733 Obstructive sleep apnea (adult) (pediatric): Secondary | ICD-10-CM | POA: Diagnosis not present

## 2020-06-08 NOTE — Progress Notes (Signed)
Date:  06/08/2020   ID:  Jim AnisJames F Diop, DOB 04-26-1951, MRN [id number] The patient was identified using 2 identifiers.   PCP:  Rodrigo RanPerini, Mark, MD  Cardiologist:  Peter SwazilandJordan, MD  Electrophysiologist:  None   Evaluation Performed:  Follow-Up Visit  Chief Complaint:  OSA  History of Present Illness:    Jim Nguyen is a 70 y.o. male with a hx of HLD, PAF and HTN who was referred by Dr. SwazilandJordan for home sleep study due to hx of PAF to rule out OSA.  He was concerned that his PAF may be occurring due to undiagnosed OSA.  He had had several episodes of PAF and Dr. SwazilandJordan referred him for home sleep study showing severe OSA with an AHI of 55.6/hr and O2 sats as low as 82%.  He underwent CPAP titration to 7cm H2O and his now here for followup.    He is doing well with his CPAP device and thinks that he has gotten used to it.  He tolerates the nasal pillow mask and feels the pressure is adequate.  He says that since going on CPAP he hasn't really noticed feeling any more rested in the am and but no significant daytime sleepiness.  He denies any significant mouth or nasal dryness or nasal congestion.  He does not think that he snores.    Past Medical History:  Diagnosis Date  . Arthritis   . Gout   . History of gallstones   . Hypercholesterolemia   . Hypertension   . Obesity   . Tobacco user   . Vertigo    Past Surgical History:  Procedure Laterality Date  . CHOLECYSTECTOMY    . COLONOSCOPY  2007     Current Meds  Medication Sig  . allopurinol (ZYLOPRIM) 300 MG tablet Take 300 mg by mouth daily.  . Ascorbic Acid (VITAMIN C PO) Take by mouth.  Marland Kitchen. atorvastatin (LIPITOR) 20 MG tablet Take 20 mg by mouth daily.  . Coenzyme Q10 (CO Q 10 PO) Take by mouth.  . diltiazem (CARDIZEM CD) 360 MG 24 hr capsule TAKE 1 CAPSULE BY MOUTH EVERY DAY  . ELIQUIS 5 MG TABS tablet TAKE 1 TABLET BY MOUTH TWICE A DAY     Allergies:   Patient has no known allergies.   Social History   Tobacco Use   . Smoking status: Former Smoker    Packs/day: 0.50    Types: Cigars, Cigarettes    Quit date: 01/22/2016    Years since quitting: 4.3  . Smokeless tobacco: Never Used  Substance Use Topics  . Alcohol use: Yes    Alcohol/week: 10.0 - 12.0 standard drinks    Types: 10 - 12 Glasses of wine per week  . Drug use: No     Family Hx: The patient's family history includes Heart disease in his father; Pancreatic cancer in his father. There is no history of Colon cancer.  ROS:   Please see the history of present illness.     All other systems reviewed and are negative.   Prior CV studies:   The following studies were reviewed today:  Home sleep study and PAP compliance download  Labs/Other Tests and Data Reviewed:    EKG:  No ECG reviewed.  Recent Labs: 09/25/2019: Hemoglobin 14.4; Platelets 174   Recent Lipid Panel Lab Results  Component Value Date/Time   CHOL 125 03/29/2011 08:36 AM   TRIG 141.0 03/29/2011 08:36 AM   HDL 33.50 (L) 03/29/2011  08:36 AM   CHOLHDL 4 03/29/2011 08:36 AM   LDLCALC 63 03/29/2011 08:36 AM    Wt Readings from Last 3 Encounters:  06/08/20 221 lb 3.2 oz (100.3 kg)  06/08/20 215 lb (97.5 kg)  04/20/20 222 lb (100.7 kg)     Objective:    Vital Signs:  BP (!) 142/70   Pulse 63   Ht 5\' 10"  (1.778 m)   Wt 221 lb 3.2 oz (100.3 kg)   SpO2 96%   BMI 31.74 kg/m    GEN: Well nourished, well developed in no acute distress HEENT: Normal NECK: No JVD; No carotid bruits LYMPHATICS: No lymphadenopathy CARDIAC:RRR, no murmurs, rubs, gallops RESPIRATORY:  Clear to auscultation without rales, wheezing or rhonchi  ABDOMEN: Soft, non-tender, non-distended MUSCULOSKELETAL:  No edema; No deformity  SKIN: Warm and dry NEUROLOGIC:  Alert and oriented x 3 PSYCHIATRIC:  Normal affect    ASSESSMENT & PLAN:    OSA - The pathophysiology of obstructive sleep apnea , it's cardiovascular consequences & modes of treatment including CPAP were discused with the  patient in detail & they evidenced understanding.  The patient is tolerating PAP therapy well without any problems. The PAP download was reviewed today and showed an AHI of 2/hr on 7 cm H2O with 100% compliance in using more than 4 hours nightly.  The patient has been using and benefiting from PAP use and will continue to benefit from therapy.   2.   HTN -continue Cardizem CD 360mg  daily and Lopressor 12.5mg  dailiy  Medication Adjustments/Labs and Tests Ordered: Current medicines are reviewed at length with the patient today.  Concerns regarding medicines are outlined above.   Tests Ordered: No orders of the defined types were placed in this encounter.   Medication Changes: No orders of the defined types were placed in this encounter.   Follow Up:  In Person in 1 year(s)  Signed, , MD  06/08/2020 3:03 PM    [facility]

## 2020-06-08 NOTE — Patient Instructions (Signed)

## 2020-06-08 NOTE — Progress Notes (Signed)
This encounter was created in error - please disregard.

## 2020-06-16 NOTE — Progress Notes (Signed)
Virtual Visit via Telephone Note   This visit type was conducted due to national recommendations for restrictions regarding the COVID-19 Pandemic (e.g. social distancing) in an effort to limit this patient's exposure and mitigate transmission in our community.  Due to his co-morbid illnesses, this patient is at least at moderate risk for complications without adequate follow up.  This format is felt to be most appropriate for this patient at this time.  The patient did not have access to video technology/had technical difficulties with video requiring transitioning to audio format only (telephone).  All issues noted in this document were discussed and addressed.  No physical exam could be performed with this format.  Please refer to the patient's chart for his  consent to telehealth for [facility].    Date:  06/17/2020   ID:  Jim Nguyen, DOB 1951-05-12, MRN [id number] The patient was identified using 2 identifiers.  Patient Location: Home Provider Location: Office/Clinic  PCP:  Crist Infante, MD  Cardiologist:  Dayveon Halley Martinique, MD  Electrophysiologist:  None   Evaluation Performed:  Follow-Up Visit  Chief Complaint:  Afib  History of Present Illness:    Jim Nguyen is a 70 y.o. male with with a hx of HTN, HLD and atrial fibrillation. Patient was previously seen by Dr. Harrell Gave in December 2020 for increased palpitation. 7 day event monitor at the time showed frequent but brief runs of PAT. Echocardiogram was normal. Due to persistent chest pain, he sent in some apple watch tracing that appears to show atrial fibrillation. Diltiazem was increased to 240 mg daily. He was seen on 07/08/2019, at which time we discussed with the patient pros and cons of anticoagulation and he preferred to avoid systemic anticoagulation for the time being. His CHA2DS2-Vasc score is 2 (age, HTN).  On 09/11/2019, he has went back into atrial fibrillation from the day before.  This was confirmed the  both on EKG and his apple watch.  Given his CHA2DS2-Vasc score of 2,  Eliquis 5 mg twice daily was recommended.   His diltiazem was increased  to 360 mg daily.  Given his snoring, a home sleep study was ordered. He did later have some hematuria. He was referred to Urology for evaluation. He reports he had cystoscopy which was negative. Renal US showed a left kidney stone. Since then he states he has no further hematuria.   He is active playing golf 4x/week. Planning to start using [employer]. He has moderated his Etoh intake. He has lost weight.   He did have a sleep study in October confirming sleep apnea and he is now on CPAP. He has been seen in the Afib clinic in November and on last visit had converted to NSR. Was given metoprolol but never started it. He has no recurrent Afib since he has been on CPAP.   The patient does not have symptoms concerning for COVID-19 infection (fever, chills, cough, or new shortness of breath).    Past Medical History:  Diagnosis Date  . Arthritis   . Gout   . History of gallstones   . Hypercholesterolemia   . Hypertension   . Obesity   . Tobacco user   . Vertigo    Past Surgical History:  Procedure Laterality Date  . CHOLECYSTECTOMY    . COLONOSCOPY  2007     Current Meds  Medication Sig  . allopurinol (ZYLOPRIM) 300 MG tablet Take 300 mg by mouth daily.  . Ascorbic Acid (VITAMIN C PO) Take  by mouth.  Marland Kitchen atorvastatin (LIPITOR) 20 MG tablet Take 20 mg by mouth daily.  . Coenzyme Q10 (CO Q 10 PO) Take by mouth.  . diltiazem (CARDIZEM CD) 360 MG 24 hr capsule TAKE 1 CAPSULE BY MOUTH EVERY DAY  . ELIQUIS 5 MG TABS tablet TAKE 1 TABLET BY MOUTH TWICE A DAY     Allergies:   Patient has no known allergies.   Social History   Tobacco Use  . Smoking status: Former Smoker    Packs/day: 0.50    Types: Cigars, Cigarettes    Quit date: 01/22/2016    Years since quitting: 4.4  . Smokeless tobacco: Never Used  Substance Use Topics  . Alcohol use: Yes     Alcohol/week: 10.0 - 12.0 standard drinks    Types: 10 - 12 Glasses of wine per week  . Drug use: No     Family Hx: The patient's family history includes Heart disease in his father; Pancreatic cancer in his father. There is no history of Colon cancer.  ROS:   Please see the history of present illness.    All other systems reviewed and are negative.   Prior CV studies:   The following studies were reviewed today:  Echo 04/15/2019 IMPRESSIONS    1. Left ventricular ejection fraction, by visual estimation, is 65 to  70%. The left ventricle has hyperdynamic function. There is no left  ventricular hypertrophy.  2. Left ventricular diastolic parameters are consistent with Grade I  diastolic dysfunction (impaired relaxation).  3. Global right ventricle has normal systolic function.The right  ventricular size is normal. No increase in right ventricular wall  thickness.  4. Left atrial size was mildly dilated.  5. Right atrial size was normal.  6. Mild mitral annular calcification.  7. The mitral valve is abnormal. Trace mitral valve regurgitation.  8. The tricuspid valve is normal in structure. Tricuspid valve  regurgitation is mild.  9. The aortic valve is tricuspid. Aortic valve regurgitation is not  visualized. Mild aortic valve sclerosis without stenosis.  10. The pulmonic valve was normal in structure. Pulmonic valve  regurgitation is trivial.    Labs/Other Tests and Data Reviewed:    EKG:  No ECG reviewed.  Recent Labs: 09/25/2019: Hemoglobin 14.4; Platelets 174   Recent Lipid Panel Lab Results  Component Value Date/Time   CHOL 125 03/29/2011 08:36 AM   TRIG 141.0 03/29/2011 08:36 AM   HDL 33.50 (L) 03/29/2011 08:36 AM   CHOLHDL 4 03/29/2011 08:36 AM   LDLCALC 63 03/29/2011 08:36 AM   Labs dated 08/14/19: cholesterol 101, triglycerides 88, HDL 37, LDL 46. A1c 5.2%. creatinine and ALT normal  Wt Readings from Last 3 Encounters:  06/17/20 218 lb  (98.9 kg)  06/08/20 221 lb 3.2 oz (100.3 kg)  06/08/20 215 lb (97.5 kg)     Risk Assessment/Calculations:     CHA2DS2-VASc Score = 2  This indicates a 2.2% annual risk of stroke. The patient's score is based upon: CHF History: No HTN History: Yes Diabetes History: No Stroke History: No Vascular Disease History: No Age Score: 1 Gender Score: 0     Objective:    Vital Signs:  BP 138/74   Pulse (!) 59   Wt 218 lb (98.9 kg)   BMI 31.28 kg/m    VITAL SIGNS:  reviewed  ASSESSMENT & PLAN:    1. PAF: Significant improvement since he is on CPAP therapy.  He is tolerating Cardizem and Eliquis well. No recurrent hematuria.  Very encouraged with lifestyle modification. Follow up in 6 months. He is scheduled for lab work in February with Dr Joylene Draft.  2. Hypertension: Blood pressure controlled.   3. Hyperlipidemia: On Lipitor.  4.   OSA now on CPAP         COVID-19 Education: The signs and symptoms of COVID-19 were discussed with the patient and how to seek care for testing (follow up with PCP or arrange E-visit).  The importance of social distancing was discussed today.  Time:   Today, I have spent 11 minutes with the patient with telehealth technology discussing the above problems.     Medication Adjustments/Labs and Tests Ordered: Current medicines are reviewed at length with the patient today.  Concerns regarding medicines are outlined above.   Tests Ordered: No orders of the defined types were placed in this encounter.   Medication Changes: No orders of the defined types were placed in this encounter.   Follow Up:  In Person in 6 month(s)  Signed, Deklynn Charlet Martinique, MD  06/17/2020 8:42 AM    [place]

## 2020-06-17 ENCOUNTER — Encounter: Payer: Self-pay | Admitting: Cardiology

## 2020-06-17 ENCOUNTER — Telehealth (INDEPENDENT_AMBULATORY_CARE_PROVIDER_SITE_OTHER): Payer: Medicare HMO | Admitting: Cardiology

## 2020-06-17 VITALS — BP 138/74 | HR 59 | Wt 218.0 lb

## 2020-06-17 DIAGNOSIS — I10 Essential (primary) hypertension: Secondary | ICD-10-CM | POA: Diagnosis not present

## 2020-06-17 DIAGNOSIS — I480 Paroxysmal atrial fibrillation: Secondary | ICD-10-CM

## 2020-06-17 DIAGNOSIS — G4733 Obstructive sleep apnea (adult) (pediatric): Secondary | ICD-10-CM | POA: Diagnosis not present

## 2020-06-17 DIAGNOSIS — E7800 Pure hypercholesterolemia, unspecified: Secondary | ICD-10-CM

## 2020-06-17 NOTE — Patient Instructions (Signed)
Medication Instructions:  Continue same medications *If you need a refill on your cardiac medications before your next appointment, please call your pharmacy*   Lab Work: None ordered   Testing/Procedures: None ordered   Follow-Up: At [facility], you and your health needs are our priority.  As part of our continuing mission to provide you with exceptional heart care, we have created designated Provider Care Teams.  These Care Teams include your primary Cardiologist (physician) and Advanced Practice Providers (APPs -  Physician Assistants and Nurse Practitioners) who all work together to provide you with the care you need, when you need it.  We recommend signing up for the patient portal called "MyChart".  Sign up information is provided on this After Visit Summary.  MyChart is used to connect with patients for Virtual Visits (Telemedicine).  Patients are able to view lab/test results, encounter notes, upcoming appointments, etc.  Non-urgent messages can be sent to your provider as well.   To learn more about what you can do with MyChart, go to [email or web address].    Your next appointment:  6 months     Call in March to schedule July appointment   The format for your next appointment: Office     Provider:  Dr.Jordan   

## 2020-07-02 DIAGNOSIS — G4733 Obstructive sleep apnea (adult) (pediatric): Secondary | ICD-10-CM | POA: Diagnosis not present

## 2020-07-09 DIAGNOSIS — G4733 Obstructive sleep apnea (adult) (pediatric): Secondary | ICD-10-CM | POA: Diagnosis not present

## 2020-07-24 ENCOUNTER — Telehealth: Payer: Self-pay | Admitting: Cardiology

## 2020-07-24 NOTE — Telephone Encounter (Signed)
Spoke with patient of Dr. Martinique who reports AFib episode for 1 week HR 90-115bpm. No acute concerns noted  He went to AFib clinic 04/06/2020 for an episode and was advised to start metoprolol tartrate but when he left clinic, he went back into sinus rhythm, per his report  Instructions (on AVS)  Take Metoprolol 25mg  tablet- Take 1/2 tablet by mouth twice daily He can STOP this medication if he ends up going back into normal sinus rhythm      Advised that he can take this medication, as previously prescribed. Track BP/pulse  He would like to know what other options he has for AFib management - antiarrhythmics, ablation Will send to MD to advise

## 2020-07-24 NOTE — Telephone Encounter (Signed)
  Pt c/o medication issue:  1. Name of Medication: metoprolol  2. How are you currently taking this medication (dosage and times per day)? As directed  3. Are you having a reaction (difficulty breathing--STAT)?  no  4. What is your medication issue?   Per answering service message:  Patient is having afib episode for a week, was given metoprolol by clinic, needs to know if its ok to take with my other medications

## 2020-07-24 NOTE — Telephone Encounter (Signed)
If Afib is persistent then would try antiarrhythmic therapy ie Flecainide or refer him to EP/Afib clinic for consideration of ablation.   Peter Martinique MD, [facility]

## 2020-07-25 ENCOUNTER — Other Ambulatory Visit: Payer: Self-pay | Admitting: Physician Assistant

## 2020-07-27 NOTE — Telephone Encounter (Signed)
I think metoprolol and diltiazem are equally efficacious for rate control. Again if he is having more Afib then I think an antiarrhythmic drug or ablation would be the way to go  Ibeth Fahmy Martinique MD, [facility]

## 2020-07-27 NOTE — Telephone Encounter (Signed)
Spoke to patient he stated he wanted Dr.Jordan to know when he had episode of Afib this past Friday he took Metoprolol 25 mg 1/2 tablet for a total of 3 doses and afib went away.Stated he was told by afib clinic to take Metoprolol as needed for afib.He wanted to ask Dr.Jordan if he needs to take Metoprolol regularly instead of Diltiazem.Message sent to [place] for advice.

## 2020-07-28 NOTE — Telephone Encounter (Signed)
Spoke to patient Dr.Jordan's advice given.Stated he will call back if he continues to have episodes of Afib.

## 2020-08-06 DIAGNOSIS — G4733 Obstructive sleep apnea (adult) (pediatric): Secondary | ICD-10-CM | POA: Diagnosis not present

## 2020-08-26 DIAGNOSIS — G4733 Obstructive sleep apnea (adult) (pediatric): Secondary | ICD-10-CM | POA: Diagnosis not present

## 2020-08-31 DIAGNOSIS — E785 Hyperlipidemia, unspecified: Secondary | ICD-10-CM | POA: Diagnosis not present

## 2020-08-31 DIAGNOSIS — E559 Vitamin D deficiency, unspecified: Secondary | ICD-10-CM | POA: Diagnosis not present

## 2020-08-31 DIAGNOSIS — Z125 Encounter for screening for malignant neoplasm of prostate: Secondary | ICD-10-CM | POA: Diagnosis not present

## 2020-09-02 DIAGNOSIS — Z1331 Encounter for screening for depression: Secondary | ICD-10-CM | POA: Diagnosis not present

## 2020-09-02 DIAGNOSIS — E785 Hyperlipidemia, unspecified: Secondary | ICD-10-CM | POA: Diagnosis not present

## 2020-09-02 DIAGNOSIS — R82998 Other abnormal findings in urine: Secondary | ICD-10-CM | POA: Diagnosis not present

## 2020-09-02 DIAGNOSIS — I480 Paroxysmal atrial fibrillation: Secondary | ICD-10-CM | POA: Diagnosis not present

## 2020-09-02 DIAGNOSIS — I10 Essential (primary) hypertension: Secondary | ICD-10-CM | POA: Diagnosis not present

## 2020-09-02 DIAGNOSIS — E669 Obesity, unspecified: Secondary | ICD-10-CM | POA: Diagnosis not present

## 2020-09-02 DIAGNOSIS — R7301 Impaired fasting glucose: Secondary | ICD-10-CM | POA: Diagnosis not present

## 2020-09-02 DIAGNOSIS — E559 Vitamin D deficiency, unspecified: Secondary | ICD-10-CM | POA: Diagnosis not present

## 2020-09-02 DIAGNOSIS — Z0000 Encounter for general adult medical examination without abnormal findings: Secondary | ICD-10-CM | POA: Diagnosis not present

## 2020-09-06 DIAGNOSIS — G4733 Obstructive sleep apnea (adult) (pediatric): Secondary | ICD-10-CM | POA: Diagnosis not present

## 2020-09-26 ENCOUNTER — Other Ambulatory Visit: Payer: Self-pay | Admitting: Physician Assistant

## 2020-09-26 DIAGNOSIS — I480 Paroxysmal atrial fibrillation: Secondary | ICD-10-CM

## 2020-09-28 ENCOUNTER — Encounter: Payer: Self-pay | Admitting: Physician Assistant

## 2020-09-28 ENCOUNTER — Telehealth: Payer: Self-pay

## 2020-09-28 ENCOUNTER — Ambulatory Visit (INDEPENDENT_AMBULATORY_CARE_PROVIDER_SITE_OTHER): Payer: Medicare HMO | Admitting: Physician Assistant

## 2020-09-28 ENCOUNTER — Other Ambulatory Visit: Payer: Self-pay

## 2020-09-28 VITALS — BP 106/60 | HR 64 | Ht 70.0 in | Wt 223.0 lb

## 2020-09-28 DIAGNOSIS — Z86010 Personal history of colonic polyps: Secondary | ICD-10-CM

## 2020-09-28 NOTE — Telephone Encounter (Signed)
[facility] Pre-operative Risk Assessment     Request for surgical clearance:     Endoscopy Procedure  What type of surgery is being performed?     Colonoscopy  When is this surgery scheduled?     12/31/2020  What type of clearance is required ?   Pharmacy  Are there any medications that need to be held prior to surgery and how long? Eliquis starting 2 days prior  Practice name and name of physician performing surgery?      [facility]  What is your office phone and fax number?      Phone- [phone]  [phone]  Anesthesia type (None, local, MAC, general) ?       [facility]

## 2020-09-28 NOTE — Progress Notes (Signed)
Subjective:    Patient ID: Jim Nguyen, male    DOB: [birth year], 70 y.o.   MRN: [id number]  HPI Jim Nguyen is a pleasant 70 year old male, established with Dr. Silverio Decamp who comes in today to discuss recall colonoscopy. He last had colonoscopy in May 2017 for follow-up of personal history of sessile serrated polyp.  He was found to have a 4 mm polyp in the descending colon which was a tubular adenoma, and sigmoid diverticulosis as well as small internal hemorrhoids. He is on chronic Eliquis for history of atrial fibrillation and followed by Dr. Martinique.  Also with history of sleep apnea with CPAP use, no O2, last echo 2020 with EF of 65%. He is asymptomatic today, with no specific GI complaints.  Review of Systems Pertinent positive and negative review of systems were noted in the above HPI section.  All other review of systems was otherwise negative.  Outpatient Encounter Medications as of 09/28/2020  Medication Sig  . allopurinol (ZYLOPRIM) 300 MG tablet Take 300 mg by mouth daily.  Marland Kitchen apixaban (ELIQUIS) 5 MG TABS tablet Take 1 tablet (5 mg total) by mouth 2 (two) times daily. LABS NEEDED FOR FURTHER REFILLS  . Ascorbic Acid (VITAMIN C PO) Take by mouth.  Marland Kitchen atorvastatin (LIPITOR) 20 MG tablet Take 20 mg by mouth daily.  . Coenzyme Q10 (CO Q 10 PO) Take by mouth.  . diltiazem (CARDIZEM CD) 360 MG 24 hr capsule TAKE 1 CAPSULE BY MOUTH EVERY DAY   No facility-administered encounter medications on file as of 09/28/2020.   No Known Allergies Patient Active Problem List   Diagnosis Date Noted  . Paroxysmal atrial fibrillation ([place]) 04/06/2020  . Secondary hypercoagulable state ([place]) 04/06/2020  . Elevated BP 03/09/2011  . Vertigo 03/09/2011  . LOW BACK PAIN, ACUTE 03/23/2010  . PURE HYPERCHOLESTEROLEMIA 11/18/2008  . HYPERLIPIDEMIA-MIXED 08/29/2008  . TOBACCO USER 08/29/2008   Social History   Socioeconomic History  . Marital status: Married    Spouse name: Not on file  . Number of  children: 1  . Years of education: Not on file  . Highest education level: Not on file  Occupational History  . 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 Smoking status: Former Smoker    Packs/day: 0.50    Types: Cigars, Cigarettes    Quit date: 01/22/2016    Years since quitting: 4.6  . Smokeless tobacco: Never Used  Substance and Sexual Activity  . Alcohol use: Yes    Alcohol/week: 10.0 - 12.0 standard drinks    Types: 10 - 12 Glasses of wine per week  . Drug use: No  . Sexual activity: Not on file  Other Topics Concern  . Not on file  Social History Narrative  . Not on file   Social Determinants of Health   Financial Resource Strain: Not on file  Food Insecurity: Not on file  Transportation Needs: Not on file  Physical Activity: Not on file  Stress: Not on file  Social Connections: Not on file  Intimate Partner Violence: Not on file    [name]'s family history includes Heart disease in his father; Pancreatic cancer in his father.      Objective:    Vitals:   09/28/20 1003  BP: 106/60  Pulse: 64    Physical Exam Well-developed well-nourished older white male in no acute distress.  Height, Weight 223, BMI 32  HEENT; nontraumatic normocephalic, EOMI, PE R LA, sclera anicteric. Oropharynx; not examined Neck;  supple, no JVD Cardiovascular; regular rate and rhythm with S1-S2, no murmur rub or gallop Pulmonary; Clear bilaterally Abdomen; soft, nontender, nondistended, no palpable mass or hepatosplenomegaly, bowel sounds are active Rectal; not done today Skin; benign exam, no jaundice rash or appreciable lesions Extremities; no clubbing cyanosis or edema skin warm and dry Neuro/Psych; alert and oriented x4, grossly nonfocal mood and affect appropriate       Assessment & Plan:   #60 70 year old white male with history of adenomatous and sessile serrated polyps.  Due for follow-up colonoscopy  Currently asymptomatic  #2 chronic  anticoagulation-on Eliquis #3 history of atrial fibrillation #4 sleep apnea with CPAP use, no O2  Plan; Patient will be scheduled for colonoscopy with Dr. Silverio Decamp.  Procedure was discussed in detail with the patient including indications risks and benefits and he is agreeable to proceed. He will need to hold Eliquis for 48 hours prior to procedure.  We will communicate with his cardiologist Dr. Martinique to assure this is reasonable for this patient.  Evaan Tidwell S Letroy Vazguez PA-C 09/28/2020   Cc: Crist Infante, MD

## 2020-09-28 NOTE — Patient Instructions (Signed)
If you are age 68 or older, your body mass index should be between 23-30. Your Body mass index is 32 kg/m. If this is out of the aforementioned range listed, please consider follow up with your Primary Care Provider.  If you are age 10 or younger, your body mass index should be between 19-25. Your Body mass index is 32 kg/m. If this is out of the aformentioned range listed, please consider follow up with your Primary Care Provider.   You have been scheduled for a colonoscopy. Please follow written instructions given to you at your visit today.  Please pick up your prep supplies at the pharmacy within the next 1-3 days. If you use inhalers (even only as needed), please bring them with you on the day of your procedure.  You will be contacted by our office prior to your procedure for directions on holding your Eliquis.  If you do not hear from our office 1 week prior to your scheduled procedure, please call [phone] to discuss.   Follow up pending at this time.  Thank you for entrusting me with your care and choosing [facility].  Amy Esterwood, PA-C

## 2020-09-28 NOTE — Telephone Encounter (Signed)
Clinical pharmacist to review Eliquis.  Patient takes Eliquis for PAF.

## 2020-09-28 NOTE — Telephone Encounter (Signed)
72m, 100.3kg, Creatinine, Serum 0.800 mg/ 08/14/2019(OVERDUE) lovw/jordan 06/17/20. Labs needed will send in a one month refill

## 2020-09-29 NOTE — Telephone Encounter (Signed)
Patient with diagnosis of afib on Eliquis for anticoagulation.    Procedure: colonoscopy Date of procedure: 12/31/20  CHA2DS2-VASc Score = 2  This indicates a 2.2% annual risk of stroke. The patient's score is based upon: CHF History: No HTN History: Yes Diabetes History: No Stroke History: No Vascular Disease History: No Age Score: 1 Gender Score: 0  CrCl >161mL/min Platelet count 174K  Per office protocol, patient can hold Eliquis for 2 days prior to procedure.

## 2020-10-02 NOTE — Progress Notes (Signed)
Reviewed and agree with documentation and assessment and plan. K. Veena Krithik Mapel , MD   

## 2020-10-06 DIAGNOSIS — G4733 Obstructive sleep apnea (adult) (pediatric): Secondary | ICD-10-CM | POA: Diagnosis not present

## 2020-10-08 NOTE — Telephone Encounter (Addendum)
called patient with phone number on file. He states clear understanding to hold his Eliquis for 48 hours prior to his procedure. No additional questions at this time.

## 2020-10-19 ENCOUNTER — Encounter (HOSPITAL_COMMUNITY): Payer: Self-pay | Admitting: Physician Assistant

## 2020-10-19 ENCOUNTER — Other Ambulatory Visit: Payer: Self-pay

## 2020-10-19 ENCOUNTER — Ambulatory Visit (HOSPITAL_COMMUNITY)
Admission: RE | Admit: 2020-10-19 | Discharge: 2020-10-19 | Disposition: A | Discharge status: Home / Self Care | Payer: Medicare HMO | Source: Ambulatory Visit | Attending: Physician Assistant | Admitting: Physician Assistant

## 2020-10-19 VITALS — BP 116/70 | HR 57 | Ht 70.0 in | Wt 218.0 lb

## 2020-10-19 DIAGNOSIS — D6869 Other thrombophilia: Secondary | ICD-10-CM | POA: Insufficient documentation

## 2020-10-19 DIAGNOSIS — Z79899 Other long term (current) drug therapy: Secondary | ICD-10-CM | POA: Insufficient documentation

## 2020-10-19 DIAGNOSIS — E669 Obesity, unspecified: Secondary | ICD-10-CM | POA: Insufficient documentation

## 2020-10-19 DIAGNOSIS — Z7182 Exercise counseling: Secondary | ICD-10-CM | POA: Diagnosis not present

## 2020-10-19 DIAGNOSIS — Z87891 Personal history of nicotine dependence: Secondary | ICD-10-CM | POA: Diagnosis not present

## 2020-10-19 DIAGNOSIS — Z7901 Long term (current) use of anticoagulants: Secondary | ICD-10-CM | POA: Insufficient documentation

## 2020-10-19 DIAGNOSIS — I10 Essential (primary) hypertension: Secondary | ICD-10-CM | POA: Insufficient documentation

## 2020-10-19 DIAGNOSIS — I480 Paroxysmal atrial fibrillation: Secondary | ICD-10-CM | POA: Insufficient documentation

## 2020-10-19 DIAGNOSIS — Z9989 Dependence on other enabling machines and devices: Secondary | ICD-10-CM | POA: Insufficient documentation

## 2020-10-19 DIAGNOSIS — I471 Supraventricular tachycardia: Secondary | ICD-10-CM | POA: Insufficient documentation

## 2020-10-19 DIAGNOSIS — G4733 Obstructive sleep apnea (adult) (pediatric): Secondary | ICD-10-CM | POA: Diagnosis not present

## 2020-10-19 DIAGNOSIS — Z6831 Body mass index (BMI) 31.0-31.9, adult: Secondary | ICD-10-CM | POA: Diagnosis not present

## 2020-10-19 LAB — BASIC METABOLIC PANEL
Anion gap: 9 (ref 5–15)
BUN: 14 mg/dL (ref 8–23)
CO2: 22 mmol/L (ref 22–32)
Calcium: 9.4 mg/dL (ref 8.9–10.3)
Chloride: 109 mmol/L (ref 98–111)
Creatinine, Ser: 0.82 mg/dL (ref 0.61–1.24)
GFR, Estimated: >60 mL/min (ref >60)
Glucose, Bld: 120 mg/dL — ABNORMAL HIGH (ref 70–99)
Potassium: 4.1 mmol/L (ref 3.5–5.1)
Sodium: 140 mmol/L (ref 135–145)

## 2020-10-19 LAB — CBC
HCT: 45.6 % (ref 39.0–52.0)
Hemoglobin: 14.9 g/dL (ref 13.0–17.0)
MCH: 31 pg (ref 26.0–34.0)
MCHC: 32.7 g/dL (ref 30.0–36.0)
MCV: 94.8 fL (ref 80.0–100.0)
Platelets: 179 10*3/uL (ref 150–400)
RBC: 4.81 MIL/uL (ref 4.22–5.81)
RDW: 13.4 % (ref 11.5–15.5)
WBC: 5.8 10*3/uL (ref 4.0–10.5)
nRBC: 0 % (ref 0.0–0.2)

## 2020-10-19 NOTE — Progress Notes (Signed)
Primary Care Physician: Crist Infante, MD Primary Cardiologist: Dr Martinique Primary Electrophysiologist: none Referring Physician: Dr Charlett Lango Jim Nguyen is a 70 y.o. male with a history of HTN, HLD, paroxysmal atrial fibrillation, atrial tachycardia, OSA, who presents for follow up in the [facility]. The patient was initially diagnosed with atrial fibrillation 06/2019 after presenting with symptoms of palpitations. He wore an event monitor which showed PAT but he did have Apple Watch strips which showed rate controlled afib (reviewed by Dr Martinique see phone note 06/11/19). Patient is on Eliquis for a CHADS2VASC score of 2.   On follow up today, patient reports he has done well since his last visit. He does have an afib episode, confirmed by his Apple Watch, about every 2-3 months. The episodes respond well to his PRN BB. There does not appear to be any specific trigger.   Today, he denies symptoms of palpitations, chest pain, shortness of breath, orthopnea, PND, lower extremity edema, dizziness, presyncope, syncope, bleeding, or neurologic sequela. The patient is tolerating medications without difficulties and is otherwise without complaint today.    Atrial Fibrillation Risk Factors:  he does have symptoms or diagnosis of sleep apnea. He is complaint with CPAP therapy. he does not have a history of rheumatic fever. he does have a history of alcohol use. The patient does not have a history of early familial atrial fibrillation or other arrhythmias.  he has a BMI of Body mass index is 31.28 kg/m.Marland Kitchen Filed Weights   10/19/20 1030  Weight: 98.9 kg    Family History  Problem Relation Age of Onset  . Heart disease Father        CABG  . Pancreatic cancer Father   . Colon cancer Neg Hx      Atrial Fibrillation Management history:  Previous antiarrhythmic drugs: none Previous cardioversions: none Previous ablations: none CHADS2VASC score:  2 Anticoagulation history: Eliquis    Past Medical History:  Diagnosis Date  . Arthritis   . Gout   . History of gallstones   . Hypercholesterolemia   . Hypertension   . Obesity   . Tobacco user   . Vertigo    Past Surgical History:  Procedure Laterality Date  . CHOLECYSTECTOMY    . COLONOSCOPY  2007    Current Outpatient Medications  Medication Sig Dispense Refill  . allopurinol (ZYLOPRIM) 300 MG tablet Take 300 mg by mouth daily.    Marland Kitchen apixaban (ELIQUIS) 5 MG TABS tablet Take 1 tablet (5 mg total) by mouth 2 (two) times daily. LABS NEEDED FOR FURTHER REFILLS 60 tablet 0  . Ascorbic Acid (VITAMIN C PO) Take by mouth.    Marland Kitchen atorvastatin (LIPITOR) 20 MG tablet Take 20 mg by mouth daily.    . cholecalciferol (VITAMIN D3) 25 MCG (1000 UNIT) tablet 1 tablet    . Coenzyme Q10 (CO Q 10 PO) Take by mouth.    . diltiazem (CARDIZEM CD) 360 MG 24 hr capsule TAKE 1 CAPSULE BY MOUTH EVERY DAY 90 capsule 3  . metoprolol tartrate (LOPRESSOR) 25 MG tablet rare use if in a-fib     No current facility-administered medications for this encounter.    Allergies  Allergen Reactions  . Tadalafil     Other reaction(s): low back pain    Social History   Socioeconomic History  . Marital status: Married    Spouse name: Not on file  . Number of children: 1  . Years of education: Not  on file  . Highest education level: Not on file  Occupational History  . 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 Smoking status: Former Smoker    Packs/day: 0.50    Types: Cigars, Cigarettes    Quit date: 01/22/2016    Years since quitting: 4.7  . Smokeless tobacco: Never Used  Substance and Sexual Activity  . Alcohol use: Yes    Alcohol/week: 10.0 - 12.0 standard drinks    Types: 10 - 12 Glasses of wine per week    Comment: 1-2 glass per day  . Drug use: No  . Sexual activity: Not on file  Other Topics Concern  . Not on file  Social History Narrative  . Not on file   Social Determinants  of Health   Financial Resource Strain: Not on file  Food Insecurity: Not on file  Transportation Needs: Not on file  Physical Activity: Not on file  Stress: Not on file  Social Connections: Not on file  Intimate Partner Violence: Not on file     ROS- All systems are reviewed and negative except as per the HPI above.  Physical Exam: Vitals:   10/19/20 1030  BP: 116/70  Pulse: (!) 57  Weight: 98.9 kg  Height: 5\' 10"  (1.778 m)    GEN- The patient is a well appearing obese male, alert and oriented x 3 today.   HEENT-head normocephalic, atraumatic, sclera clear, conjunctiva pink, hearing intact, trachea midline. Lungs- Clear to ausculation bilaterally, normal work of breathing Heart- Regular rate and rhythm, no murmurs, rubs or gallops  GI- soft, NT, ND, + BS Extremities- no clubbing, cyanosis, or edema MS- no significant deformity or atrophy Skin- no rash or lesion Psych- euthymic mood, full affect Neuro- strength and sensation are intact   Wt Readings from Last 3 Encounters:  10/19/20 98.9 kg  09/28/20 101.2 kg  06/17/20 98.9 kg    EKG today demonstrates  SB Vent. rate 57 BPM PR interval 158 ms QRS duration 84 ms QT/QTcB 408/397 ms  Echo 04/15/19 demonstrated  1. Left ventricular ejection fraction, by visual estimation, is 65 to  70%. The left ventricle has hyperdynamic function. There is no left  ventricular hypertrophy.  2. Left ventricular diastolic parameters are consistent with Grade I  diastolic dysfunction (impaired relaxation).  3. Global right ventricle has normal systolic function.The right  ventricular size is normal. No increase in right ventricular wall  thickness.  4. Left atrial size was mildly dilated.  5. Right atrial size was normal.  6. Mild mitral annular calcification.  7. The mitral valve is abnormal. Trace mitral valve regurgitation.  8. The tricuspid valve is normal in structure. Tricuspid valve  regurgitation is mild.  9. The  aortic valve is tricuspid. Aortic valve regurgitation is not  visualized. Mild aortic valve sclerosis without stenosis.  10. The pulmonic valve was normal in structure. Pulmonic valve  regurgitation is trivial.   Epic records are reviewed at length today  CHA2DS2-VASc Score = 2  The patient's score is based upon: CHF History: No HTN History: Yes Diabetes History: No Stroke History: No Vascular Disease History: No Age Score: 1 Gender Score: 0      ASSESSMENT AND PLAN: 1. Paroxysmal Atrial Fibrillation (ICD10:  I48.0) The patient's CHA2DS2-VASc score is 2, indicating a 2.2% annual risk of stroke.   Patient doing well with infrequent episodes.  Would consider flecainide, Multaq, or ablation if patient has more frequent episodes of afib. Continue diltiazem 360 mg  daily Continue Lopressor 12.5 mg PRN q 6 hours for afib. Continue Eliquis 5 mg BID  2. Secondary Hypercoagulable State (ICD10:  D68.69) The patient is at significant risk for stroke/thromboembolism based upon his CHA2DS2-VASc Score of 2.  Continue Apixaban (Eliquis).   3. Obesity Body mass index is 31.28 kg/m. Lifestyle modification was discussed and encouraged including regular physical activity and weight reduction.  4. Obstructive sleep apnea Patient reports compliance with CPAP therapy.  5. HTN Stable, no changes today.   Follow up in the AF clinic in 1 year.    [facility] [address] [place], [place] [address] [phone] 10/19/2020 11:24 AM

## 2020-10-25 ENCOUNTER — Other Ambulatory Visit: Payer: Self-pay | Admitting: Cardiology

## 2020-10-26 NOTE — Telephone Encounter (Signed)
19m, 101.2kg, scr0.82 10/19/20, lovw/fenton 10/19/20

## 2020-11-06 DIAGNOSIS — G4733 Obstructive sleep apnea (adult) (pediatric): Secondary | ICD-10-CM | POA: Diagnosis not present

## 2020-11-23 DIAGNOSIS — J069 Acute upper respiratory infection, unspecified: Secondary | ICD-10-CM | POA: Diagnosis not present

## 2020-12-06 DIAGNOSIS — G4733 Obstructive sleep apnea (adult) (pediatric): Secondary | ICD-10-CM | POA: Diagnosis not present

## 2020-12-29 ENCOUNTER — Telehealth: Payer: Self-pay | Admitting: Physician Assistant

## 2020-12-29 NOTE — Telephone Encounter (Signed)
Patient aware.

## 2020-12-29 NOTE — Telephone Encounter (Signed)
Ok to proceed with colonoscopy  

## 2020-12-29 NOTE — Telephone Encounter (Signed)
Pt is scheduled for colon with Dr. Silverio Decamp on 7/28. He was just dx with an inguinal hernia so he wants to know if it is ok to proceed with procedure. Pls call him

## 2020-12-29 NOTE — Telephone Encounter (Signed)
Spoke with the patient. He has not seen the surgeon for discussion of the repair options. Patient wanted to be certain there was no reason to postpone his colonoscopy.

## 2020-12-31 ENCOUNTER — Ambulatory Visit (AMBULATORY_SURGERY_CENTER): Payer: Medicare HMO | Admitting: Gastroenterology

## 2020-12-31 ENCOUNTER — Encounter: Payer: Self-pay | Admitting: Gastroenterology

## 2020-12-31 ENCOUNTER — Other Ambulatory Visit: Payer: Self-pay

## 2020-12-31 VITALS — BP 104/65 | HR 55 | Temp 98.0°F | Resp 13 | Ht 70.0 in | Wt 223.0 lb

## 2020-12-31 DIAGNOSIS — Z86010 Personal history of colonic polyps: Secondary | ICD-10-CM

## 2020-12-31 DIAGNOSIS — D123 Benign neoplasm of transverse colon: Secondary | ICD-10-CM | POA: Diagnosis not present

## 2020-12-31 DIAGNOSIS — G4733 Obstructive sleep apnea (adult) (pediatric): Secondary | ICD-10-CM | POA: Diagnosis not present

## 2020-12-31 DIAGNOSIS — I4891 Unspecified atrial fibrillation: Secondary | ICD-10-CM | POA: Diagnosis not present

## 2020-12-31 DIAGNOSIS — I10 Essential (primary) hypertension: Secondary | ICD-10-CM | POA: Diagnosis not present

## 2020-12-31 DIAGNOSIS — D128 Benign neoplasm of rectum: Secondary | ICD-10-CM | POA: Diagnosis not present

## 2020-12-31 MED ORDER — SODIUM CHLORIDE 0.9 % IV SOLN: 500.0000 mL | Freq: Once | INTRAVENOUS | Status: DC

## 2020-12-31 NOTE — Progress Notes (Signed)
Report to PACU, RN, vss, BBS= Clear.  

## 2020-12-31 NOTE — Op Note (Addendum)
[place] Patient Name: Jim Nguyen Procedure Date: 12/31/2020 9:19 AM MRN: [id number] Endoscopist: Mauri Pole , MD Age: 70 Referring MD:  Date of Birth: Jun 15, 1950 Gender: Male Account #: [id number] Procedure:                Colonoscopy Indications:              High risk colon cancer surveillance: Personal                            history of colonic polyps, High risk colon cancer                            surveillance: Personal history of adenoma (10 mm or                            greater in size), High risk colon cancer                            surveillance: Personal history of multiple (3 or                            more) adenomas, Last colonoscopy 5 years ago Medicines:                Monitored Anesthesia Care Procedure:                Pre-Anesthesia Assessment:                           - Prior to the procedure, a History and Physical                            was performed, and patient medications and                            allergies were reviewed. The patient's tolerance of                            previous anesthesia was also reviewed. The risks                            and benefits of the procedure and the sedation                            options and risks were discussed with the patient.                            All questions were answered, and informed consent                            was obtained. Prior Anticoagulants: The patient                            last took Eliquis (apixaban) 2 days prior to the  procedure. ASA Grade Assessment: II - A patient                            with mild systemic disease. After reviewing the                            risks and benefits, the patient was deemed in                            satisfactory condition to undergo the procedure.                           After obtaining informed consent, the colonoscope                            was passed under direct  vision. Throughout the                            procedure, the patient's blood pressure, pulse, and                            oxygen saturations were monitored continuously. The                            PCF-HQ190L Colonoscope was introduced through the                            anus and advanced to the the cecum, identified by                            appendiceal orifice and ileocecal valve. The                            colonoscopy was performed without difficulty. The                            patient tolerated the procedure well. The quality                            of the bowel preparation was excellent. The                            ileocecal valve, appendiceal orifice, and rectum                            were photographed. Scope In: 9:34:23 AM Scope Out: 9:51:33 AM Scope Withdrawal Time: 0 hours 12 minutes 16 seconds  Total Procedure Duration: 0 hours 17 minutes 10 seconds  Findings:                 The perianal and digital rectal examinations were                            normal.  A 5 mm polyp was found in the transverse colon. The                            polyp was sessile. The polyp was removed with a                            cold snare. Resection and retrieval were complete.                           A 1 mm polyp was found in the rectum. The polyp was                            sessile. The polyp was removed with a cold biopsy                            forceps. Resection and retrieval were complete.                           Non-bleeding external and internal hemorrhoids were                            found during retroflexion. The hemorrhoids were                            medium-sized. Complications:            No immediate complications. Estimated Blood Loss:     Estimated blood loss was minimal. Impression:               - One 5 mm polyp in the transverse colon, removed                            with a cold snare. Resected  and retrieved.                           - One 1 mm polyp in the rectum, removed with a cold                            biopsy forceps. Resected and retrieved.                           - Non-bleeding external and internal hemorrhoids. Recommendation:           - Patient has a contact number available for                            emergencies. The signs and symptoms of potential                            delayed complications were discussed with the                            patient. Return to normal activities tomorrow.  Written discharge instructions were provided to the                            patient.                           - Resume previous diet.                           - Continue present medications.                           - Await pathology results.                           - Repeat colonoscopy in 5 years for surveillance                            based on pathology results.                           - Resume Eliquis (apixaban) at prior dose tomorrow.                            Refer to managing physician for further adjustment                            of therapy. Mauri Pole, MD 12/31/2020 9:55:39 AM This report has been signed electronically.

## 2020-12-31 NOTE — Patient Instructions (Signed)
YOU HAD AN ENDOSCOPIC PROCEDURE TODAY AT THE [facility]:   Refer to the procedure report that was given to you for any specific questions about what was found during the examination.  If the procedure report does not answer your questions, please call your gastroenterologist to clarify.  If you requested that your care partner not be given the details of your procedure findings, then the procedure report has been included in a sealed envelope for you to review at your convenience later.  YOU SHOULD EXPECT: Some feelings of bloating in the abdomen. Passage of more gas than usual.  Walking can help get rid of the air that was put into your GI tract during the procedure and reduce the bloating. If you had a lower endoscopy (such as a colonoscopy or flexible sigmoidoscopy) you may notice spotting of blood in your stool or on the toilet paper. If you underwent a bowel prep for your procedure, you may not have a normal bowel movement for a few days.  Please Note:  You might notice some irritation and congestion in your nose or some drainage.  This is from the oxygen used during your procedure.  There is no need for concern and it should clear up in a day or so.  SYMPTOMS TO REPORT IMMEDIATELY:   Following lower endoscopy (colonoscopy or flexible sigmoidoscopy):  Excessive amounts of blood in the stool  Significant tenderness or worsening of abdominal pains  Swelling of the abdomen that is new, acute  Fever of 100F or higher  For urgent or emergent issues, a gastroenterologist can be reached at any hour by calling [phone]. Do not use MyChart messaging for urgent concerns.    DIET:  We do recommend a small meal at first, but then you may proceed to your regular diet.  Drink plenty of fluids but you should avoid alcoholic beverages for 24 hours.  ACTIVITY:  You should plan to take it easy for the rest of today and you should NOT DRIVE or use heavy machinery until tomorrow (because  of the sedation medicines used during the test).    FOLLOW UP: Our staff will call the number listed on your records 48-72 hours following your procedure to check on you and address any questions or concerns that you may have regarding the information given to you following your procedure. If we do not reach you, we will leave a message.  We will attempt to reach you two times.  During this call, we will ask if you have developed any symptoms of COVID 19. If you develop any symptoms (ie: fever, flu-like symptoms, shortness of breath, cough etc.) before then, please call [phone].  If you test positive for Covid 19 in the 2 weeks post procedure, please call and report this information to us.    If any biopsies were taken you will be contacted by phone or by letter within the next 1-3 weeks.  Please call us at [phone] if you have not heard about the biopsies in 3 weeks.    SIGNATURES/CONFIDENTIALITY: You and/or your care partner have signed paperwork which will be entered into your electronic medical record.  These signatures attest to the fact that that the information above on your After Visit Summary has been reviewed and is understood.  Full responsibility of the confidentiality of this discharge information lies with you and/or your care-partner. 

## 2020-12-31 NOTE — Progress Notes (Signed)
Called to room to assist during endoscopic procedure.  Patient ID and intended procedure confirmed with present staff. Received instructions for my participation in the procedure from the performing physician.  

## 2021-01-04 ENCOUNTER — Telehealth: Payer: Self-pay | Admitting: *Deleted

## 2021-01-04 NOTE — Telephone Encounter (Signed)
  Follow up Call-  Call back number 12/31/2020  Post procedure Call Back phone  # [phone]  Permission to leave phone message Yes  Some recent data might be hidden     Patient questions:  Do you have a fever, pain , or abdominal swelling? No. Pain Score  0 *  Have you tolerated food without any problems? Yes.    Have you been able to return to your normal activities? Yes.    Do you have any questions about your discharge instructions: Diet   No. Medications  No. Follow up visit  No.  Do you have questions or concerns about your Care? No.  Actions: * If pain score is 4 or above: No action needed, pain <4.  Have you developed a fever since your procedure? no  2.   Have you had an respiratory symptoms (SOB or cough) since your procedure? no  3.   Have you tested positive for COVID 19 since your procedure no  4.   Have you had any family members/close contacts diagnosed with the COVID 19 since your procedure?  no   If yes to any of these questions please route to Joylene John, RN and Joella Prince, RN

## 2021-01-06 DIAGNOSIS — G4733 Obstructive sleep apnea (adult) (pediatric): Secondary | ICD-10-CM | POA: Diagnosis not present

## 2021-01-21 ENCOUNTER — Encounter: Payer: Self-pay | Admitting: Gastroenterology

## 2021-01-28 ENCOUNTER — Ambulatory Visit: Payer: Self-pay | Admitting: Surgery

## 2021-01-28 DIAGNOSIS — I480 Paroxysmal atrial fibrillation: Secondary | ICD-10-CM | POA: Diagnosis not present

## 2021-01-28 DIAGNOSIS — K4090 Unilateral inguinal hernia, without obstruction or gangrene, not specified as recurrent: Secondary | ICD-10-CM | POA: Diagnosis not present

## 2021-01-29 ENCOUNTER — Telehealth: Payer: Self-pay | Admitting: *Deleted

## 2021-01-29 NOTE — Telephone Encounter (Signed)
   [facility] Pre-operative Risk Assessment    Patient Name: Jim Nguyen  DOB: 08-02-1950 MRN: [id number]  HEARTCARE STAFF:  - IMPORTANT!!!!!! Under Visit Info/Reason for Call, type in Other and utilize the format Clearance MM/DD/YY or Clearance TBD. Do not use dashes or single digits. - Please review there is not already an duplicate clearance open for this procedure. - If request is for dental extraction, please clarify the # of teeth to be extracted. - If the patient is currently at the dentist's office, call Pre-Op Callback Staff (MA/nurse) to input urgent request.  - If the patient is not currently in the dentist office, please route to the Pre-Op pool.  Request for surgical clearance:  What type of surgery is being performed? Hernia surgery  When is this surgery scheduled? TBD  What type of clearance is required (medical clearance vs. Pharmacy clearance to hold med vs. Both)? both  Are there any medications that need to be held prior to surgery and how long? Eliquis-need direction  Practice name and name of physician performing surgery? [facility]  What is the office phone number? [phone]   7.   What is the office fax number? [place] attn, kelly dockery lpn  8.   Anesthesia type (None, local, MAC, general) ? general   Fredia Beets 01/29/2021, 10:59 AM  _________________________________________________________________   (provider comments below)

## 2021-02-02 NOTE — Telephone Encounter (Signed)
Patient with diagnosis of afib on Eliquis for anticoagulation.    Procedure: Hernia surgery Date of procedure: TBD  CHA2DS2-VASc Score = 2  This indicates a 2.2% annual risk of stroke. The patient's score is based upon: CHF History: No HTN History: Yes Diabetes History: No Stroke History: No Vascular Disease History: No Age Score: 1 Gender Score: 0     CrCl 102 ml/min  Per office protocol, patient can hold Eliquis for 2 days prior to procedure.

## 2021-02-02 NOTE — Telephone Encounter (Signed)
    Patient Name: Jim Nguyen  DOB: [birth year] MRN: [id number]  Primary Cardiologist: Peter Martinique, MD  Chart reviewed as part of pre-operative protocol coverage. Patient was last seen by Malka So, PA-C, in the [facility] in 10/2020 at which time he was doing well from a cardiac standpoint. Patient was contacted today for further pre-op evaluation and reported doing well since last visit. He had one episode of atrial fibrillation in 11/2020 that lasted for about 12 hours but has done well since then with no recurrence. No chest pain, shortness of breath, orthopnea/PND, palpitations, lightheadedness, dizziness, syncope. He stays active and is able to complete >4.0 METS without any problems. Per Revised Cardiac Risk Index, considered low risk with a 0.9% chance of an adverse cardiac event perioperatively. Given past medical history and time since last visit, based on ACC/AHA guidelines, [name] would be at acceptable risk for the planned procedure without further cardiovascular testing.   Per Pharmacy and office protocol, patient can hold Eliquis for 2 days prior to procedure. Please restart this as soon as able postoperatively. Recommend continuing Diltiazem perioperatively given history of atrial fibrillation.  The patient was advised that if he develops new symptoms prior to surgery to contact our office to arrange for a follow-up visit, and he verbalized understanding.  I will route this recommendation to the requesting party via Epic fax function and remove from pre-op pool.  Please call with questions.  Darreld Mclean, PA-C 02/02/2021, 10:51 AM

## 2021-02-06 DIAGNOSIS — G4733 Obstructive sleep apnea (adult) (pediatric): Secondary | ICD-10-CM | POA: Diagnosis not present

## 2021-02-08 ENCOUNTER — Encounter (HOSPITAL_BASED_OUTPATIENT_CLINIC_OR_DEPARTMENT_OTHER): Payer: Self-pay | Admitting: Surgery

## 2021-02-08 DIAGNOSIS — K4090 Unilateral inguinal hernia, without obstruction or gangrene, not specified as recurrent: Secondary | ICD-10-CM

## 2021-02-08 NOTE — H&P (Signed)
REFERRING PHYSICIAN: Jerlyn Ly, MD  PROVIDER: [name], MD  DOB: November 28, 1950    Chief Complaint: Inguinal Hernia (Right inguinal hernia)  History of Present Illness:  Patient is referred by Dr. Crist Nguyen for surgical evaluation and management of a newly diagnosed right inguinal hernia. Patient had experience right groin pain while playing golf. This occurred on several episodes. Patient was seen and evaluated by his primary care physician and diagnosed with right inguinal hernia. Patient has had no signs or symptoms of intestinal obstruction. His only prior abdominal surgery was laparoscopic cholecystectomy in the 1990s. He has had no prior hernia repairs. He presents today accompanied by his wife to discuss repair of right inguinal hernia.  Review of Systems: A complete review of systems was obtained from the patient. I have reviewed this information and discussed as appropriate with the patient. See HPI as well for other ROS.  Review of Systems  Constitutional: Negative.  HENT: Negative.  Eyes: Negative.  Respiratory: Negative.  Cardiovascular: Positive for palpitations.  Gastrointestinal: Positive for abdominal pain.  Genitourinary: Negative.  Musculoskeletal: Negative.  Skin: Negative.  Neurological: Negative.  Endo/Heme/Allergies: Negative.  Psychiatric/Behavioral: Negative.    Medical History: Past Medical History:  Diagnosis Date   Arrhythmia   Arthritis   Patient Active Problem List  Diagnosis   Right inguinal hernia   Paroxysmal atrial fibrillation (CMS-HCC)   Past Surgical History:  Procedure Laterality Date   CHOLECYSTECTOMY    No Known Allergies  Current Outpatient Medications on File Prior to Visit  Medication Sig Dispense Refill   apixaban (ELIQUIS) 5 mg tablet Eliquis Take (oral) [id number] tablet No frequency recorded oral No set duration recorded No set duration amount recorded active 5 mg   diltiazem (TIAZAC) 360 MG ER  capsule Take 1 capsule by mouth once daily   allopurinol (ZYLOPRIM) 300 MG tablet Take by mouth.   atorvastatin (LIPITOR) 10 MG tablet Take by mouth.   No current facility-administered medications on file prior to visit.   Family History  Problem Relation Age of Onset   Skin cancer Father   Coronary Artery Disease (Blocked arteries around heart) Father    Social History   Tobacco Use  Smoking Status Former Smoker  Smokeless Tobacco Never Used    Social History   Socioeconomic History   Marital status: Unknown  Tobacco Use   Smoking status: Former Smoker   Smokeless tobacco: Never Used  Scientific laboratory technician Use: Unknown  Substance and Sexual Activity   Alcohol use: Yes   Drug use: No   Objective:   Vitals:  BP: 120/72  Pulse: 72  Temp: 36.7 C (98 F)  SpO2: 98%  Weight: (!) 106.6 kg (235 lb)  Height: 177.8 cm ('5\' 10"'$ )   Body mass index is 33.72 kg/m.  Physical Exam   GENERAL APPEARANCE Development: normal Nutritional status: normal Gross deformities: none  SKIN Rash, lesions, ulcers: none Induration, erythema: none Nodules: none palpable  EYES Conjunctiva and lids: normal Pupils: equal and reactive Iris: normal bilaterally  EARS, NOSE, MOUTH, THROAT External ears: no lesion or deformity External nose: no lesion or deformity Hearing: grossly normal Due to Covid-19 pandemic, patient is wearing a mask.  NECK Symmetric: yes Trachea: midline Thyroid: no palpable nodules in the thyroid bed  CHEST Respiratory effort: normal Retraction or accessory muscle use: no Breath sounds: normal bilaterally Rales, rhonchi, wheeze: none  CARDIOVASCULAR Auscultation: regular rhythm, normal rate Murmurs: none Pulses: radial pulse 2+ palpable  Lower extremity edema: none  ABDOMEN Distension: none Masses: none palpable Tenderness: none Hepatosplenomegaly: not present Hernia: not present  GENITOURINARY Penis: no lesions Scrotum: no masses Palpation  in the right inguinal canal with cough and Valsalva shows a small to medium sized inguinal hernia which is mildly tender to palpation. It is reducible but augments with cough and Valsalva. Palpation in the left inguinal canal with cough and Valsalva shows no sign of hernia.  MUSCULOSKELETAL Station and gait: normal Digits and nails: no clubbing or cyanosis Muscle strength: grossly normal all extremities Range of motion: grossly normal all extremities Deformity: none  LYMPHATIC Cervical: none palpable Supraclavicular: none palpable  PSYCHIATRIC Oriented to person, place, and time: yes Mood and affect: normal for situation Judgment and insight: appropriate for situation  Assessment and Plan:  Diagnoses and all orders for this visit:  Right inguinal hernia  Paroxysmal atrial fibrillation (CMS-HCC)   Patient presents on referral from his primary care physician for surgical evaluation and management of a right inguinal hernia which has been mildly symptomatic. Patient is provided with written literature on hernia surgery to review at home.  On examination, the patient has a small to moderate size right inguinal hernia which is symptomatic. It is reducible. I have recommended open repair with mesh as the technique of choice which will yield the lowest risk of recurrence. We discussed the procedure. We discussed the use of mesh. We discussed restrictions on his activities after surgery. We discussed doing this as an outpatient surgical procedure. The patient understands and wishes to proceed with surgery in the near future.  Patient has atrial fibrillation. We will contact his cardiologist for preoperative cardiac clearance. The patient is on Eliquis and will need to discontinue this for 3 days prior to his procedure. Patient does have sleep apnea but does use CPAP. He will need to use this following surgery.  The risks and benefits of the procedure have been discussed at length with the  patient. The patient understands the proposed procedure, potential alternative treatments, and the course of recovery to be expected. All of the patient's questions have been answered at this time. The patient wishes to proceed with surgery.  Jim Gemma, MD [facility] [place] practice Office: [phone]

## 2021-02-10 ENCOUNTER — Encounter (HOSPITAL_BASED_OUTPATIENT_CLINIC_OR_DEPARTMENT_OTHER): Payer: Self-pay | Admitting: Surgery

## 2021-02-10 ENCOUNTER — Other Ambulatory Visit: Payer: Self-pay

## 2021-02-10 NOTE — Progress Notes (Signed)
Spoke w/ via phone for pre-op interview--- pt Lab needs dos----  [employer] results------ current ekg in epic/ chart COVID test -----patient states asymptomatic no test needed Arrive at ------- 0830 on 02-12-2021 NPO after MN NO Solid Food.  Clear liquids from MN until--- 0730 Med rec completed Medications to take morning of surgery ----- diltiazem, allopurinol, zantac, and take lopressor if needed Diabetic medication ----- n/a Patient instructed no nail polish to be worn day of surgery Patient instructed to bring photo id and insurance card day of surgery Patient aware to have Driver (ride ) / caregiver  for 24 hours after surgery --wife, Thersa Salt Patient Special Instructions ----- n/a Pre-Op special Istructions ----- pt has telephone cardiac clearance by Sande Rives PA on 02-02-2021, in epic/ chart Patient verbalized understanding of instructions that were given at this phone interview. Patient denies shortness of breath, chest pain, fever, cough at this phone interview.    Anesthesia Review: HTN;  PAF on eliquis;  OSA stated uses nightly;  pt denies cardiac s&s, sob, and no peripheral swelling.  Pt take lopressor as needed for afib, stated last taken one July 2022.  PCP: Dr Joylene Draft Cardiologist : Dr [facility] (lov 10-19-2020 epic) Chest x-ray : no EKG : 10-19-2020 epic Echo : 04-15-2019 epic Stress test:  ETT 10-09-2008 in epic under encounter, normal with no ischemia Cardiac Cath :  no Activity level: denies sob w/ any activity Sleep Study/ CPAP : Yes/ Yes Blood Thinner/ Instructions /Last Dose: Eliquis ASA / Instructions/ Last Dose :  no Per pt was given instructions from cardiology to stop two days prior, last dose 02-09-2021

## 2021-02-12 ENCOUNTER — Encounter (HOSPITAL_BASED_OUTPATIENT_CLINIC_OR_DEPARTMENT_OTHER)
Admission: RE | Disposition: A | Discharge status: Home / Self Care | Payer: Self-pay | Source: Home / Self Care | Attending: Surgery

## 2021-02-12 ENCOUNTER — Ambulatory Visit (HOSPITAL_BASED_OUTPATIENT_CLINIC_OR_DEPARTMENT_OTHER): Payer: Medicare HMO | Admitting: Anesthesiology

## 2021-02-12 ENCOUNTER — Ambulatory Visit (HOSPITAL_BASED_OUTPATIENT_CLINIC_OR_DEPARTMENT_OTHER)
Admission: RE | Admit: 2021-02-12 | Discharge: 2021-02-12 | Disposition: A | Discharge status: Home / Self Care | Payer: Medicare HMO | Attending: Surgery | Admitting: Surgery

## 2021-02-12 ENCOUNTER — Encounter (HOSPITAL_BASED_OUTPATIENT_CLINIC_OR_DEPARTMENT_OTHER): Payer: Self-pay | Admitting: Surgery

## 2021-02-12 DIAGNOSIS — K4090 Unilateral inguinal hernia, without obstruction or gangrene, not specified as recurrent: Secondary | ICD-10-CM | POA: Diagnosis not present

## 2021-02-12 DIAGNOSIS — G4733 Obstructive sleep apnea (adult) (pediatric): Secondary | ICD-10-CM | POA: Diagnosis not present

## 2021-02-12 DIAGNOSIS — Z87891 Personal history of nicotine dependence: Secondary | ICD-10-CM | POA: Insufficient documentation

## 2021-02-12 DIAGNOSIS — Z7901 Long term (current) use of anticoagulants: Secondary | ICD-10-CM | POA: Diagnosis not present

## 2021-02-12 DIAGNOSIS — Z9049 Acquired absence of other specified parts of digestive tract: Secondary | ICD-10-CM | POA: Insufficient documentation

## 2021-02-12 DIAGNOSIS — I480 Paroxysmal atrial fibrillation: Secondary | ICD-10-CM | POA: Diagnosis not present

## 2021-02-12 DIAGNOSIS — Z9989 Dependence on other enabling machines and devices: Secondary | ICD-10-CM | POA: Diagnosis not present

## 2021-02-12 DIAGNOSIS — Z79899 Other long term (current) drug therapy: Secondary | ICD-10-CM | POA: Diagnosis not present

## 2021-02-12 DIAGNOSIS — E782 Mixed hyperlipidemia: Secondary | ICD-10-CM | POA: Diagnosis not present

## 2021-02-12 HISTORY — DX: Long term (current) use of anticoagulants: Z79.01

## 2021-02-12 HISTORY — DX: Personal history of colonic polyps: Z86.010

## 2021-02-12 HISTORY — DX: Gastro-esophageal reflux disease without esophagitis: K21.9

## 2021-02-12 HISTORY — PX: INGUINAL HERNIA REPAIR: SHX194

## 2021-02-12 HISTORY — DX: Chronic gout, unspecified, without tophus (tophi): M1A.9XX0

## 2021-02-12 HISTORY — DX: Obstructive sleep apnea (adult) (pediatric): G47.33

## 2021-02-12 HISTORY — DX: Other specified postprocedural states: R11.2

## 2021-02-12 HISTORY — DX: Personal history of adenomatous and serrated colon polyps: Z86.0101

## 2021-02-12 HISTORY — DX: Hyperlipidemia, unspecified: E78.5

## 2021-02-12 LAB — POCT I-STAT, CHEM 8
BUN: 17 mg/dL (ref 8–23)
Calcium, Ion: 1.27 mmol/L (ref 1.15–1.40)
Chloride: 104 mmol/L (ref 98–111)
Creatinine, Ser: 0.7 mg/dL (ref 0.61–1.24)
Glucose, Bld: 117 mg/dL — ABNORMAL HIGH (ref 70–99)
HCT: 51 % (ref 39.0–52.0)
Hemoglobin: 17.3 g/dL — ABNORMAL HIGH (ref 13.0–17.0)
Potassium: 4.2 mmol/L (ref 3.5–5.1)
Sodium: 141 mmol/L (ref 135–145)
TCO2: 25 mmol/L (ref 22–32)

## 2021-02-12 SURGERY — REPAIR, HERNIA, INGUINAL, ADULT
Anesthesia: General | Site: Groin | Laterality: Right

## 2021-02-12 MED ORDER — LIDOCAINE HCL (PF) 2 % IJ SOLN
INTRAMUSCULAR | Status: AC
  Filled 2021-02-12: qty 5

## 2021-02-12 MED ORDER — FENTANYL CITRATE (PF) 100 MCG/2ML IJ SOLN
INTRAMUSCULAR | Status: AC
  Filled 2021-02-12: qty 2

## 2021-02-12 MED ORDER — PROMETHAZINE HCL 25 MG/ML IJ SOLN: 6.2500 mg | INTRAMUSCULAR | Status: DC | PRN

## 2021-02-12 MED ORDER — MIDAZOLAM HCL 2 MG/2ML IJ SOLN
INTRAMUSCULAR | Status: AC
  Filled 2021-02-12: qty 2

## 2021-02-12 MED ORDER — LACTATED RINGERS IV SOLN: INTRAVENOUS | Status: DC

## 2021-02-12 MED ORDER — AMISULPRIDE (ANTIEMETIC) 5 MG/2ML IV SOLN
10.0000 mg | Freq: Once | INTRAVENOUS | Status: DC | PRN
Start: 2021-02-12 — End: 2021-02-12

## 2021-02-12 MED ORDER — BUPIVACAINE LIPOSOME 1.3 % IJ SUSP
INTRAMUSCULAR | Status: DC | PRN
  Administered 2021-02-12: 20 mL

## 2021-02-12 MED ORDER — CEFAZOLIN SODIUM-DEXTROSE 2-4 GM/100ML-% IV SOLN
2.0000 g | INTRAVENOUS | Status: AC
  Administered 2021-02-12: 2 g via INTRAVENOUS

## 2021-02-12 MED ORDER — FENTANYL CITRATE (PF) 100 MCG/2ML IJ SOLN: 25.0000 ug | INTRAMUSCULAR | Status: DC | PRN

## 2021-02-12 MED ORDER — DEXAMETHASONE SODIUM PHOSPHATE 10 MG/ML IJ SOLN
INTRAMUSCULAR | Status: AC
  Filled 2021-02-12: qty 1

## 2021-02-12 MED ORDER — CHLORHEXIDINE GLUCONATE CLOTH 2 % EX PADS: 6.0000 | MEDICATED_PAD | Freq: Once | CUTANEOUS | Status: DC

## 2021-02-12 MED ORDER — PROPOFOL 10 MG/ML IV BOLUS
INTRAVENOUS | Status: DC | PRN
  Administered 2021-02-12: 50 mg via INTRAVENOUS
  Administered 2021-02-12: 150 mg via INTRAVENOUS

## 2021-02-12 MED ORDER — ACETAMINOPHEN 500 MG PO TABS
1000.0000 mg | ORAL_TABLET | Freq: Once | ORAL | Status: AC
  Administered 2021-02-12: 1000 mg via ORAL

## 2021-02-12 MED ORDER — PROPOFOL 10 MG/ML IV BOLUS
INTRAVENOUS | Status: AC
  Filled 2021-02-12: qty 20

## 2021-02-12 MED ORDER — BUPIVACAINE HCL 0.5 % IJ SOLN
INTRAMUSCULAR | Status: DC | PRN
  Administered 2021-02-12: 20 mL

## 2021-02-12 MED ORDER — ACETAMINOPHEN 500 MG PO TABS
ORAL_TABLET | ORAL | Status: AC
  Filled 2021-02-12: qty 2

## 2021-02-12 MED ORDER — BUPIVACAINE LIPOSOME 1.3 % IJ SUSP: 20.0000 mL | Freq: Once | INTRAMUSCULAR | Status: DC

## 2021-02-12 MED ORDER — CEFAZOLIN SODIUM-DEXTROSE 2-4 GM/100ML-% IV SOLN
INTRAVENOUS | Status: AC
  Filled 2021-02-12: qty 100

## 2021-02-12 MED ORDER — DEXAMETHASONE SODIUM PHOSPHATE 10 MG/ML IJ SOLN
INTRAMUSCULAR | Status: DC | PRN
  Administered 2021-02-12: 10 mg via INTRAVENOUS

## 2021-02-12 MED ORDER — ONDANSETRON HCL 4 MG/2ML IJ SOLN
INTRAMUSCULAR | Status: DC | PRN
  Administered 2021-02-12: 4 mg via INTRAVENOUS

## 2021-02-12 MED ORDER — ROCURONIUM BROMIDE 10 MG/ML (PF) SYRINGE
PREFILLED_SYRINGE | INTRAVENOUS | Status: AC
  Filled 2021-02-12: qty 10

## 2021-02-12 MED ORDER — SUGAMMADEX SODIUM 200 MG/2ML IV SOLN
INTRAVENOUS | Status: DC | PRN
  Administered 2021-02-12: 200 mg via INTRAVENOUS

## 2021-02-12 MED ORDER — FENTANYL CITRATE (PF) 100 MCG/2ML IJ SOLN
INTRAMUSCULAR | Status: DC | PRN
  Administered 2021-02-12 (×3): 50 ug via INTRAVENOUS

## 2021-02-12 MED ORDER — LIDOCAINE 2% (20 MG/ML) 5 ML SYRINGE
INTRAMUSCULAR | Status: DC | PRN
  Administered 2021-02-12: 80 mg via INTRAVENOUS

## 2021-02-12 MED ORDER — OXYCODONE HCL 5 MG PO TABS: 5.0000 mg | ORAL_TABLET | Freq: Once | ORAL | Status: DC | PRN

## 2021-02-12 MED ORDER — MIDAZOLAM HCL 5 MG/5ML IJ SOLN
INTRAMUSCULAR | Status: DC | PRN
  Administered 2021-02-12: 2 mg via INTRAVENOUS

## 2021-02-12 MED ORDER — TRAMADOL HCL 50 MG PO TABS: 50.0000 mg | ORAL_TABLET | Freq: Four times a day (QID) | ORAL | 0 refills | Status: DC | PRN

## 2021-02-12 MED ORDER — OXYCODONE HCL 5 MG/5ML PO SOLN: 5.0000 mg | Freq: Once | ORAL | Status: DC | PRN

## 2021-02-12 MED ORDER — 0.9 % SODIUM CHLORIDE (POUR BTL) OPTIME
TOPICAL | Status: DC | PRN
  Administered 2021-02-12: 500 mL

## 2021-02-12 MED ORDER — ROCURONIUM BROMIDE 10 MG/ML (PF) SYRINGE
PREFILLED_SYRINGE | INTRAVENOUS | Status: DC | PRN
  Administered 2021-02-12: 70 mg via INTRAVENOUS

## 2021-02-12 MED ORDER — ONDANSETRON HCL 4 MG/2ML IJ SOLN
INTRAMUSCULAR | Status: AC
  Filled 2021-02-12: qty 2

## 2021-02-12 SURGICAL SUPPLY — 38 items
ADH SKN CLS APL DERMABOND .7 (GAUZE/BANDAGES/DRESSINGS) ×1
APL PRP STRL LF DISP 70% ISPRP (MISCELLANEOUS) ×1
BLADE CLIPPER SENSICLIP SURGIC (BLADE) ×2 IMPLANT
BLADE SURG 15 STRL LF DISP TIS (BLADE) ×1 IMPLANT
BLADE SURG 15 STRL SS (BLADE) ×2
CHLORAPREP W/TINT 26 (MISCELLANEOUS) ×2 IMPLANT
COVER BACK TABLE 60X90IN (DRAPES) ×2 IMPLANT
COVER MAYO STAND STRL (DRAPES) ×2 IMPLANT
DERMABOND ADVANCED (GAUZE/BANDAGES/DRESSINGS) ×1
DERMABOND ADVANCED .7 DNX12 (GAUZE/BANDAGES/DRESSINGS) ×1 IMPLANT
DRAIN PENROSE 0.5X18 (DRAIN) ×1 IMPLANT
DRAPE LAPAROTOMY TRNSV 102X78 (DRAPES) ×2 IMPLANT
DRAPE UTILITY XL STRL (DRAPES) ×2 IMPLANT
ELECT REM PT RETURN 9FT ADLT (ELECTROSURGICAL) ×2
ELECTRODE REM PT RTRN 9FT ADLT (ELECTROSURGICAL) ×1 IMPLANT
GAUZE 4X4 16PLY ~~LOC~~+RFID DBL (SPONGE) ×2 IMPLANT
GLOVE SURG ORTHO LTX SZ8 (GLOVE) ×2 IMPLANT
GLOVE SURG POLYISO LF SZ7.5 (GLOVE) ×1 IMPLANT
GLOVE SURG UNDER POLY LF SZ7 (GLOVE) ×1 IMPLANT
GOWN STRL REUS W/TWL LRG LVL3 (GOWN DISPOSABLE) ×2 IMPLANT
GOWN STRL REUS W/TWL XL LVL3 (GOWN DISPOSABLE) ×2 IMPLANT
KIT TURNOVER CYSTO (KITS) ×2 IMPLANT
MANIFOLD NEPTUNE II (INSTRUMENTS) ×1 IMPLANT
MESH ULTRAPRO 3X6 7.6X15CM (Mesh General) ×2 IMPLANT
NDL HYPO 25X1 1.5 SAFETY (NEEDLE) ×1 IMPLANT
NEEDLE HYPO 25X1 1.5 SAFETY (NEEDLE) ×2 IMPLANT
NS IRRIG 500ML POUR BTL (IV SOLUTION) ×2 IMPLANT
PACK BASIN DAY SURGERY FS (CUSTOM PROCEDURE TRAY) ×2 IMPLANT
PENCIL SMOKE EVACUATOR (MISCELLANEOUS) ×2 IMPLANT
SUT MNCRL AB 4-0 PS2 18 (SUTURE) ×2 IMPLANT
SUT NOVA 0 T19/GS 22DT (SUTURE) ×1 IMPLANT
SUT NOVA NAB GS-22 2 0 T19 (SUTURE) ×4 IMPLANT
SUT VIC AB 3-0 SH 18 (SUTURE) ×2 IMPLANT
SYR BULB EAR ULCER 3OZ GRN STR (SYRINGE) ×2 IMPLANT
SYR CONTROL 10ML LL (SYRINGE) ×2 IMPLANT
TOWEL OR 17X26 10 PK STRL BLUE (TOWEL DISPOSABLE) ×2 IMPLANT
TUBE CONNECTING 12X1/4 (SUCTIONS) ×2 IMPLANT
YANKAUER SUCT BULB TIP NO VENT (SUCTIONS) ×2 IMPLANT

## 2021-02-12 NOTE — Interval H&P Note (Signed)
History and Physical Interval Note:  02/12/2021 9:57 AM  Jim Nguyen  has presented today for surgery, with the diagnosis of RIGHT INGUINAL HERNIA, REDUCIBLE.  The various methods of treatment have been discussed with the patient and family. After consideration of risks, benefits and other options for treatment, the patient has consented to    Procedure(s): RIGHT INGUINAL HERNIA REPAIR WITH MESH (Right) as a surgical intervention.    The patient's history has been reviewed, patient examined, no change in status, stable for surgery.  I have reviewed the patient's chart and labs.  Questions were answered to the patient's satisfaction.    Armandina Gemma, [facility] [place] practice Office: [place]

## 2021-02-12 NOTE — Transfer of Care (Signed)
Immediate Anesthesia Transfer of Care Note  Patient: Jim Nguyen  Procedure(s) Performed: RIGHT INGUINAL HERNIA REPAIR WITH MESH (Right: Groin)  Patient Location: PACU  Anesthesia Type:General  Level of Consciousness: drowsy, patient cooperative and responds to stimulation  Airway & Oxygen Therapy: Patient Spontanous Breathing and Patient connected to face mask oxygen  Post-op Assessment: Report given to RN and Post -op Vital signs reviewed and stable  Post vital signs: Reviewed and stable  Last Vitals:  Vitals Value Taken Time  BP 115/67 02/12/21 1151  Temp 36.4 C 02/12/21 1151  Pulse 57 02/12/21 1153  Resp 14 02/12/21 1153  SpO2 96 % 02/12/21 1153  Vitals shown include unvalidated device data.  Last Pain:  Vitals:   02/12/21 0851  TempSrc: Oral  PainSc: 0-No pain      Patients Stated Pain Goal: 5 ([phone] [id number])  Complications: No notable events documented.

## 2021-02-12 NOTE — Anesthesia Postprocedure Evaluation (Signed)
Anesthesia Post Note  Patient: Jim Nguyen  Procedure(s) Performed: RIGHT INGUINAL HERNIA REPAIR WITH MESH (Right: Groin)     Patient location during evaluation: PACU Anesthesia Type: General Level of consciousness: awake Pain management: pain level controlled Vital Signs Assessment: post-procedure vital signs reviewed and stable Respiratory status: spontaneous breathing and respiratory function stable Cardiovascular status: stable Postop Assessment: no apparent nausea or vomiting Anesthetic complications: no   No notable events documented.  Last Vitals:  Vitals:   02/12/21 1200 02/12/21 1215  BP: 118/69 125/72  Pulse: (!) 57 61  Resp: 17 14  Temp:    SpO2: 96% 95%    Last Pain:  Vitals:   02/12/21 1215  TempSrc:   PainSc: 0-No pain                 [name]

## 2021-02-12 NOTE — Anesthesia Preprocedure Evaluation (Signed)
Anesthesia Evaluation  Patient identified by MRN, date of birth, ID band Patient awake    Reviewed: Allergy & Precautions, NPO status , Patient's Chart, lab work & pertinent test results  History of Anesthesia Complications (+) PONV and history of anesthetic complications  Airway Mallampati: II  TM Distance: >3 FB Neck ROM: Full    Dental no notable dental hx.    Pulmonary sleep apnea and Continuous Positive Airway Pressure Ventilation , former smoker,    Pulmonary exam normal breath sounds clear to auscultation       Cardiovascular hypertension, Pt. on medications + dysrhythmias (on eliquis; paroxysmal afib) Atrial Fibrillation  Rhythm:Irregular Rate:Normal     Neuro/Psych negative neurological ROS  negative psych ROS   GI/Hepatic Neg liver ROS, GERD  ,  Endo/Other  obesity  Renal/GU negative Renal ROS  negative genitourinary   Musculoskeletal  (+) Arthritis ,   Abdominal   Peds negative pediatric ROS (+)  Hematology negative hematology ROS (+)   Anesthesia Other Findings   Reproductive/Obstetrics negative OB ROS                             Anesthesia Physical Anesthesia Plan  ASA: 3  Anesthesia Plan: General   Post-op Pain Management:    Induction: Intravenous  PONV Risk Score and Plan: Treatment may vary due to age or medical condition, Midazolam, Ondansetron and Dexamethasone  Airway Management Planned: Oral ETT  Additional Equipment:   Intra-op Plan:   Post-operative Plan: Extubation in OR  Informed Consent: I have reviewed the patients History and Physical, chart, labs and discussed the procedure including the risks, benefits and alternatives for the proposed anesthesia with the patient or authorized representative who has indicated his/her understanding and acceptance.     Dental advisory given  Plan Discussed with: CRNA  Anesthesia Plan Comments: (Last dose  of Eliquis on 02/09/21)        Anesthesia Quick Evaluation

## 2021-02-12 NOTE — Op Note (Signed)
Procedure Note  Pre-operative Diagnosis:  right inguinal hernia, reducible  Post-operative Diagnosis: same  Procedure:  Open right inguinal hernia repair with mesh  Surgeon:  Armandina Gemma, MD  Anesthesia:  General  Preparation:  Chlora-prep  Estimated Blood Loss: minimal  Complications:  none  Indications: Patient is referred by Dr. Crist Infante for surgical evaluation and management of a newly diagnosed right inguinal hernia. Patient had experience right groin pain while playing golf. This occurred on several episodes. Patient was seen and evaluated by his primary care physician and diagnosed with right inguinal hernia. Patient has had no signs or symptoms of intestinal obstruction. His only prior abdominal surgery was laparoscopic cholecystectomy in the 1990s. He has had no prior hernia repairs. He presents today accompanied by his wife to discuss repair of right inguinal hernia.  Procedure Details  The patient was evaluated in the holding area. All of the patient's questions were answered and the proposed procedure was confirmed. The site of the procedure was properly marked. The patient was taken to the Operating Room, identified by name, and the procedure verified as inguinal hernia repair.  The patient was placed in the supine position and underwent induction of anesthesia. A "Time Out" was performed per routine. The lower abdomen and groin were prepped and draped in the usual aseptic fashion.  After ascertaining that an adequate level of anesthesia had been obtained, an incision was made in the groin with a #10 blade.  Dissection was carried through the subcutaneous tissues and hemostasis obtained with the electrocautery.  A Gelpi retractor was placed for exposure.  The external oblique fascia was incised in line with it's fibers and extended through the external inguinal ring.  The cord structures were dissected out of the inguinal canal and encircled with a Penrose drain.  The floor of  the inguinal canal was dissected out.  There was a large direct defect.  Hernia sac was dissected out and reduced.  The defect was closed with interrupted 0-Novofil sutures.  The cord was explored and there was no sign of an indirect hernia.  The floor of the inguinal canal was reconstructed with Ethicon Ultrapro mesh cut to the appropriate dimensions.  It was secured to the pubic tubercle with a 2-0 Novafil suture and along the inguinal ligament with a running 2-0 Novafil suture.  Mesh was split to accommodate the cord structures.  The superior margin of the mesh was secured to the transversalis and internal oblique musculature with interrupted 2-0 Novafil sutures.  The tails of the mesh were overlapped lateral to the cord structures and secured to the inguinal ligament with interrupted 2-0 Novafil sutures to recreate the internal inguinal ring.  Cord structures were returned to the inguinal canal.  Local anesthetic was infiltrated throughout the field.  External oblique fascia was closed with interrupted 3-0 Vicryl sutures.  Subcutaneous tissues were closed with interrupted 3-0 Vicryl sutures.  Skin was anesthetized with local anesthetic, and the skin edges were re-approximated with a running 4-0 Monocryl suture.  Wound was washed and dried and Dermabond was applied.  Instrument, sponge, and needle counts were correct prior to closure and at the conclusion of the case.  The patient tolerated the procedure well.  The patient was awakened from anesthesia and brought to the recovery room in stable condition.  Armandina Gemma, MD [facility] Office: [phone]

## 2021-02-12 NOTE — Anesthesia Procedure Notes (Signed)
Procedure Name: Intubation Date/Time: 02/12/2021 10:24 AM Performed by: Rogers Blocker, CRNA Pre-anesthesia Checklist: Patient identified, Emergency Drugs available, Suction available and Patient being monitored Patient Re-evaluated:Patient Re-evaluated prior to induction Oxygen Delivery Method: Circle System Utilized Preoxygenation: Pre-oxygenation with 100% oxygen Induction Type: IV induction Ventilation: Mask ventilation without difficulty Laryngoscope Size: Mac and 4 Grade View: Grade I Tube type: Oral Number of attempts: 1 Airway Equipment and Method: Stylet Placement Confirmation: ETT inserted through vocal cords under direct vision, positive ETCO2 and breath sounds checked- equal and bilateral Secured at: 22 cm Tube secured with: Tape Dental Injury: Teeth and Oropharynx as per pre-operative assessment

## 2021-02-12 NOTE — Discharge Instructions (Addendum)
[facility]  HERNIA REPAIR POST OP INSTRUCTIONS  Always review your discharge instruction sheet given to you by the facility where your surgery was performed.  A  prescription for pain medication may be sent to your pharmacy on discharge.  Take your pain medication as prescribed.  If narcotic pain medicine is not needed, then you may take acetaminophen (Tylenol) or ibuprofen (Advil) as needed.  Take your usually prescribed medications unless otherwise directed.  If you need a refill on your pain medication, please contact your pharmacy.  They will contact our office to request authorization. Prescriptions will not be filled after 5:00 PM daily or on weekends.  You should follow a light diet the first 24 hours after arrival home, such as soup and crackers or toast.  Be sure to include plenty of fluids daily.  Resume your normal diet the day after surgery.  Most patients will experience some swelling and bruising around the surgical site.  Ice packs and reclining will help.  Swelling and bruising can take several days to resolve.   It is common to experience some constipation if taking pain medication after surgery.  Increasing fluid intake and taking a stool softener (such as Colace) will usually help or prevent this problem from occurring.  A mild laxative (Milk of Magnesia or Miralax) should be taken according to package directions if there is no bowel movement after 48 hours.  You will likely have Dermabond (topical glue) over your incisions.  This seals the incisions and allows you to bathe and shower at any time after your surgery.  Glue should remain in place for up to 10 days.  It may be removed after 10 days by pealing off the Dermabond material or using Vaseline or naval jelly to remove.  ACTIVITIES:  You may resume regular (light) daily activities beginning the next day - such as daily self-care, walking, climbing stairs - gradually increasing activities as tolerated.  You  may have sexual intercourse when it is comfortable.  Refrain from any heavy lifting or straining until approved by your doctor.  You may drive when you are no longer taking prescription pain medication, when you can comfortably wear a seatbelt, and when you can safely maneuver your car and apply the brakes.  You should see your doctor in the office for a follow-up appointment approximately 2-3 weeks after your surgery.  Make sure that you call for this appointment within a day or two after you arrive home to insure a convenient appointment time.  WHEN TO CALL YOUR DOCTOR: Fever greater than 101.0 Inability to urinate Persistent nausea and/or vomiting Extreme swelling or bruising Continued bleeding from incision Increased pain, redness, or drainage from the incision  The clinic staff is available to answer your questions during regular business hours.  Please don't hesitate to call and ask to speak to one of the nurses for clinical concerns.  If you have a medical emergency, go to the nearest emergency room or call 911.  A surgeon from [facility] is always on call for the hospital.   [facility] [address], [place], [place], [place]  [address]  [phone] ? [phone] ? FAX [phone]    Post Anesthesia Home Care Instructions  Activity: Get plenty of rest for the remainder of the day. A responsible individual must stay with you for 24 hours following the procedure.  For the next 24 hours, DO NOT: -Drive a car -Paediatric nurse -Drink alcoholic beverages -Take  any medication unless instructed by your physician -Make any legal decisions or sign important papers.  Meals: Start with liquid foods such as gelatin or soup. Progress to regular foods as tolerated. Avoid greasy, spicy, heavy foods. If nausea and/or vomiting occur, drink only clear liquids until the nausea and/or vomiting subsides. Call your physician if vomiting  continues.  Special Instructions/Symptoms: Your throat may feel dry or sore from the anesthesia or the breathing tube placed in your throat during surgery. If this causes discomfort, gargle with warm salt water. The discomfort should disappear within 24 hours.  If you had a scopolamine patch placed behind your ear for the management of post- operative nausea and/or vomiting:  1. The medication in the patch is effective for 72 hours, after which it should be removed.  Wrap patch in a tissue and discard in the trash. Wash hands thoroughly with soap and water. 2. You may remove the patch earlier than 72 hours if you experience unpleasant side effects which may include dry mouth, dizziness or visual disturbances. 3. Avoid touching the patch. Wash your hands with soap and water after contact with the patch.    Information for Discharge Teaching: EXPAREL (bupivacaine liposome injectable suspension)   Your surgeon or anesthesiologist gave you EXPAREL(bupivacaine) to help control your pain after surgery.  EXPAREL is a local anesthetic that provides pain relief by numbing the tissue around the surgical site. EXPAREL is designed to release pain medication over time and can control pain for up to 72 hours. Depending on how you respond to EXPAREL, you may require less pain medication during your recovery.  Possible side effects: Temporary loss of sensation or ability to move in the area where bupivacaine was injected. Nausea, vomiting, constipation Rarely, numbness and tingling in your mouth or lips, lightheadedness, or anxiety may occur. Call your doctor right away if you think you may be experiencing any of these sensations, or if you have other questions regarding possible side effects.  Follow all other discharge instructions given to you by your surgeon or nurse. Eat a healthy diet and drink plenty of water or other fluids.  If you return to the hospital for any reason within 96 hours following  the administration of EXPAREL, it is important for health care providers to know that you have received this anesthetic. A teal colored band has been placed on your arm with the date, time and amount of EXPAREL you have received in order to alert and inform your health care providers. Please leave this armband in place for the full 96 hours following administration, and then you may remove the band. (May remove Tuesday, 9/13)  Next dose of Tylenol/acetaminophen after 3 PM today if needed.

## 2021-02-15 ENCOUNTER — Encounter (HOSPITAL_BASED_OUTPATIENT_CLINIC_OR_DEPARTMENT_OTHER): Payer: Self-pay | Admitting: Surgery

## 2021-03-08 DIAGNOSIS — G4733 Obstructive sleep apnea (adult) (pediatric): Secondary | ICD-10-CM | POA: Diagnosis not present

## 2021-04-08 DIAGNOSIS — G4733 Obstructive sleep apnea (adult) (pediatric): Secondary | ICD-10-CM | POA: Diagnosis not present

## 2021-04-22 ENCOUNTER — Other Ambulatory Visit: Payer: Self-pay | Admitting: Physician Assistant

## 2021-05-20 NOTE — Progress Notes (Signed)
Cardiology Office Note   Date:  05/24/2021   ID:  Jim Nguyen, DOB 05-11-1951, MRN [id number]  PCP:  Crist Infante, MD  Cardiologist:   Sharnise Blough Martinique, MD   Chief Complaint  Patient presents with   Atrial Fibrillation      History of Present Illness: Jim Nguyen is a 70 y.o. male who presents for follow up Afib. He has  a hx of HTN, HLD and atrial fibrillation.  Patient was previously seen by Dr. Harrell Gave in December 2020 for increased palpitation.  7 day event monitor at the time showed frequent but brief runs of PAT.  Echocardiogram was normal.  Due to persistent chest pain, he sent in some apple watch tracing that appears to show atrial fibrillation.  Diltiazem was increased to 240 mg daily.  He was seen on 07/08/2019, at which time we discussed with the patient pros and cons of anticoagulation and he preferred to avoid systemic anticoagulation for the time being. His CHA2DS2-Vasc score is 2 (age, HTN).  On 09/11/2019, he has went back into atrial fibrillation from the day before.  This was confirmed the both on EKG and his apple watch.  Given his CHA2DS2-Vasc score of 2,  Eliquis 5 mg twice daily was recommended.   His diltiazem was increased  to 360 mg daily.  Given his snoring, a home sleep study was ordered. He did later have some hematuria. He was referred to Urology for evaluation. He reports he had cystoscopy which was negative. Renal US showed a left kidney stone. Since then he states he has no further hematuria.    He did have a sleep study last year confirming sleep apnea and he is now on CPAP. He has been seen in the Afib clinic in November and on last visit had converted to NSR. Was given metoprolol prn but never started it. He  has been on CPAP nightly.  Followed by Dr Radford Pax for sleep apnea.   He notes he has been in Afib since Thursday night. Thinks stress may be a trigger. No chest pain or dyspnea. Typically HR in the 90s with Afib. Tolerates it OK. Did have hernia  surgery this fall. With inactivity with this he has gained weight. Notes he typically has Afib every 6 weeks lasting less than 12 hours.  Past Medical History:  Diagnosis Date   Anticoagulant long-term use    Eliquis-- managed by cardiologist   Arthritis    Chronic gout    per pt last episode approx. 2018   GERD (gastroesophageal reflux disease)    History of adenomatous polyp of colon    Hyperlipidemia    Hypertension    followed by cardiology and pcp   (ETT 10-09-2008 in epic, normal without ischemia)   Inguinal hernia of right side without obstruction or gangrene    OSA on CPAP    followed by dr t. turner   PAF (paroxysmal atrial fibrillation) ([place]) 05/2019   cardiologist-- dr Martinique--  long hx palpitations   PONV (postoperative nausea and vomiting)     Past Surgical History:  Procedure Laterality Date   COLONOSCOPY     last one 07/ 2022   INGUINAL HERNIA REPAIR Right 02/12/2021   Procedure: RIGHT INGUINAL HERNIA REPAIR WITH MESH;  Surgeon: Armandina Gemma, MD;  Location: [place];  Service: General;  Laterality: Right;   [place]     Current Outpatient Medications  Medication Sig Dispense Refill   allopurinol (ZYLOPRIM)  300 MG tablet Take 300 mg by mouth daily.     Ascorbic Acid (VITAMIN C PO) Take 1 tablet by mouth daily.     atorvastatin (LIPITOR) 20 MG tablet Take 20 mg by mouth at bedtime.     cholecalciferol (VITAMIN D3) 25 MCG (1000 UNIT) tablet Take 1,000 Units by mouth daily.     Coenzyme Q10 (CO Q 10 PO) Take 300 mg by mouth daily.     diltiazem (CARDIZEM CD) 360 MG 24 hr capsule TAKE 1 CAPSULE BY MOUTH EVERY DAY (Patient taking differently: Take 360 mg by mouth daily.) 90 capsule 3   ELIQUIS 5 MG TABS tablet TAKE 1 TABLET BY MOUTH TWICE A DAY 180 tablet 3   flecainide (TAMBOCOR) 100 MG tablet Take 3 tablets as needed for an episode of Afib. Only take once a day 30 tablet 6   metoprolol tartrate (LOPRESSOR) 25 MG tablet  Take 25 mg by mouth as directed. Per cardiologist take as needed for Afib     ranitidine (ZANTAC) 150 MG capsule Take 150 mg by mouth as needed for heartburn.     traMADol (ULTRAM) 50 MG tablet Take 1-2 tablets (50-100 mg total) by mouth every 6 (six) hours as needed for moderate pain or severe pain. 24 tablet 0   No current facility-administered medications for this visit.    Allergies:   Tadalafil    Social History:  The patient  reports that he quit smoking about 5 years ago. His smoking use included cigars and cigarettes. He has a 15.00 pack-year smoking history. He has never used smokeless tobacco. He reports current alcohol use of about 10.0 - 12.0 standard drinks per week. He reports that he does not use drugs.   Family History:  The patient's family history includes Heart disease in his father; Pancreatic cancer in his father.    ROS:  Please see the history of present illness.   Otherwise, review of systems are positive for none.   All other systems are reviewed and negative.    PHYSICAL EXAM: VS:  BP (!) 151/77 (BP Location: Left Arm)    Pulse 96    Ht 5\' 10"  (1.778 m)    Wt 235 lb 12.8 oz (107 kg)    SpO2 100%    BMI 33.83 kg/m  , BMI Body mass index is 33.83 kg/m. GEN: Well nourished, well developed, in no acute distress HEENT: normal Neck: no JVD, carotid bruits, or masses Cardiac: IRRR; no murmurs, rubs, or gallops,no edema  Respiratory:  clear to auscultation bilaterally, normal work of breathing GI: soft, nontender, nondistended, + BS MS: no deformity or atrophy Skin: warm and dry, no rash Neuro:  Strength and sensation are intact Psych: euthymic mood, full affect   EKG:  EKG is not ordered today. The ekg ordered today demonstrates N/A   Recent Labs: 10/19/2020: Platelets 179 02/12/2021: BUN 17; Creatinine, Ser 0.70; Hemoglobin 17.3; Potassium 4.2; Sodium 141    Lipid Panel    Component Value Date/Time   CHOL 125 03/29/2011 0836   TRIG 141.0 03/29/2011 0836    HDL 33.50 (L) 03/29/2011 0836   CHOLHDL 4 03/29/2011 0836   VLDL 28.2 03/29/2011 0836   LDLCALC 63 03/29/2011 0836    Dated 08/31/20: cholesterol 126, triglycerides 152, HDL 41, LDL 55. A1c 5.5%. CMET and TSH normal. Dated 02/12/22: CBC normal.  Wt Readings from Last 3 Encounters:  05/24/21 235 lb 12.8 oz (107 kg)  02/12/21 226 lb 6.4 oz (102.7 kg)  12/31/20 223 lb (101.2 kg)      Other studies Reviewed: Additional studies/ records that were reviewed today include:  Echo 04/15/2019 IMPRESSIONS     1. Left ventricular ejection fraction, by visual estimation, is 65 to  70%. The left ventricle has hyperdynamic function. There is no left  ventricular hypertrophy.   2. Left ventricular diastolic parameters are consistent with Grade I  diastolic dysfunction (impaired relaxation).   3. Global right ventricle has normal systolic function.The right  ventricular size is normal. No increase in right ventricular wall  thickness.   4. Left atrial size was mildly dilated.   5. Right atrial size was normal.   6. Mild mitral annular calcification.   7. The mitral valve is abnormal. Trace mitral valve regurgitation.   8. The tricuspid valve is normal in structure. Tricuspid valve  regurgitation is mild.   9. The aortic valve is tricuspid. Aortic valve regurgitation is not  visualized. Mild aortic valve sclerosis without stenosis.  10. The pulmonic valve was normal in structure. Pulmonic valve  regurgitation is trivial.     ASSESSMENT AND PLAN:  1.  PAF: Still having episodes despite being compliant with CPAP.  He is tolerating Cardizem and Eliquis well. Rate is controlled.   Very encouraged with lifestyle modification. We discussed options for further therapy including maintenance AAD therapy. I think it would be worth trying a "pill in a pocket: strategy with Flecainide 300 mg orally prn AFib. If he has more frequent or sustained arrhythmia we may have to consider maintenance therapy.    Hypertension: Blood pressure controlled.    Hyperlipidemia: On Lipitor.   4.   OSA now on CPAP   Current medicines are reviewed at length with the patient today.  The patient does not have concerns regarding medicines.  The following changes have been made:  Flecainide 300 mg prn Afib as "pill in a pocket"   Labs/ tests ordered today include:  No orders of the defined types were placed in this encounter.        Disposition:   FU with me in 6 months  Signed, Allycia Pitz Martinique, MD  05/24/2021 4:06 PM    [facility] [address] [place], [place], [address] Phone [phone], Fax [phone]

## 2021-05-24 ENCOUNTER — Ambulatory Visit: Payer: Medicare HMO | Admitting: Cardiology

## 2021-05-24 ENCOUNTER — Other Ambulatory Visit: Payer: Self-pay

## 2021-05-24 ENCOUNTER — Encounter: Payer: Self-pay | Admitting: Cardiology

## 2021-05-24 VITALS — BP 151/77 | HR 96 | Ht 70.0 in | Wt 235.8 lb

## 2021-05-24 DIAGNOSIS — D2272 Melanocytic nevi of left lower limb, including hip: Secondary | ICD-10-CM | POA: Diagnosis not present

## 2021-05-24 DIAGNOSIS — L821 Other seborrheic keratosis: Secondary | ICD-10-CM | POA: Diagnosis not present

## 2021-05-24 DIAGNOSIS — I480 Paroxysmal atrial fibrillation: Secondary | ICD-10-CM

## 2021-05-24 DIAGNOSIS — G4733 Obstructive sleep apnea (adult) (pediatric): Secondary | ICD-10-CM | POA: Diagnosis not present

## 2021-05-24 DIAGNOSIS — E7800 Pure hypercholesterolemia, unspecified: Secondary | ICD-10-CM

## 2021-05-24 DIAGNOSIS — L820 Inflamed seborrheic keratosis: Secondary | ICD-10-CM | POA: Diagnosis not present

## 2021-05-24 DIAGNOSIS — D225 Melanocytic nevi of trunk: Secondary | ICD-10-CM | POA: Diagnosis not present

## 2021-05-24 DIAGNOSIS — L812 Freckles: Secondary | ICD-10-CM | POA: Diagnosis not present

## 2021-05-24 DIAGNOSIS — D2271 Melanocytic nevi of right lower limb, including hip: Secondary | ICD-10-CM | POA: Diagnosis not present

## 2021-05-24 DIAGNOSIS — L570 Actinic keratosis: Secondary | ICD-10-CM | POA: Diagnosis not present

## 2021-05-24 MED ORDER — FLECAINIDE ACETATE 100 MG PO TABS: ORAL_TABLET | ORAL | 6 refills | Status: DC

## 2021-05-24 NOTE — Patient Instructions (Signed)
We will try Flecainide 300 mg orally as a "pill in the pocket" to try and convert your Afib.

## 2021-06-01 DIAGNOSIS — M25521 Pain in right elbow: Secondary | ICD-10-CM | POA: Diagnosis not present

## 2021-06-04 DIAGNOSIS — M25621 Stiffness of right elbow, not elsewhere classified: Secondary | ICD-10-CM | POA: Diagnosis not present

## 2021-06-04 DIAGNOSIS — M25521 Pain in right elbow: Secondary | ICD-10-CM | POA: Diagnosis not present

## 2021-06-04 DIAGNOSIS — M19021 Primary osteoarthritis, right elbow: Secondary | ICD-10-CM | POA: Diagnosis not present

## 2021-06-09 ENCOUNTER — Other Ambulatory Visit: Payer: Self-pay | Admitting: Cardiology

## 2021-06-09 DIAGNOSIS — M25521 Pain in right elbow: Secondary | ICD-10-CM | POA: Diagnosis not present

## 2021-06-09 DIAGNOSIS — M19021 Primary osteoarthritis, right elbow: Secondary | ICD-10-CM | POA: Diagnosis not present

## 2021-06-09 DIAGNOSIS — M25621 Stiffness of right elbow, not elsewhere classified: Secondary | ICD-10-CM | POA: Diagnosis not present

## 2021-06-11 DIAGNOSIS — M19021 Primary osteoarthritis, right elbow: Secondary | ICD-10-CM | POA: Diagnosis not present

## 2021-06-11 DIAGNOSIS — M25521 Pain in right elbow: Secondary | ICD-10-CM | POA: Diagnosis not present

## 2021-06-11 DIAGNOSIS — M25621 Stiffness of right elbow, not elsewhere classified: Secondary | ICD-10-CM | POA: Diagnosis not present

## 2021-06-14 DIAGNOSIS — M25521 Pain in right elbow: Secondary | ICD-10-CM | POA: Diagnosis not present

## 2021-06-14 DIAGNOSIS — M25621 Stiffness of right elbow, not elsewhere classified: Secondary | ICD-10-CM | POA: Diagnosis not present

## 2021-06-14 DIAGNOSIS — M19021 Primary osteoarthritis, right elbow: Secondary | ICD-10-CM | POA: Diagnosis not present

## 2021-06-15 ENCOUNTER — Encounter: Payer: Self-pay | Admitting: Cardiology

## 2021-06-15 ENCOUNTER — Telehealth (INDEPENDENT_AMBULATORY_CARE_PROVIDER_SITE_OTHER): Payer: Medicare HMO | Admitting: Cardiology

## 2021-06-15 ENCOUNTER — Other Ambulatory Visit: Payer: Self-pay

## 2021-06-15 VITALS — Ht 70.0 in | Wt 225.0 lb

## 2021-06-15 DIAGNOSIS — I10 Essential (primary) hypertension: Secondary | ICD-10-CM | POA: Diagnosis not present

## 2021-06-15 DIAGNOSIS — G4733 Obstructive sleep apnea (adult) (pediatric): Secondary | ICD-10-CM

## 2021-06-15 NOTE — Patient Instructions (Signed)
Medication Instructions:  Your physician recommends that you continue on your current medications as directed. Please refer to the Current Medication list given to you today.  *If you need a refill on your cardiac medications before your next appointment, please call your pharmacy*   Lab Work: NONE   If you have labs (blood work) drawn today and your tests are completely normal, you will receive your results only by: [facility] (if you have MyChart) OR A paper copy in the mail If you have any lab test that is abnormal or we need to change your treatment, we will call you to review the results.   Testing/Procedures: NONE    Follow-Up: At [facility], you and your health needs are our priority.  As part of our continuing mission to provide you with exceptional heart care, we have created designated Provider Care Teams.  These Care Teams include your primary Cardiologist (physician) and Advanced Practice Providers (APPs -  Physician Assistants and Nurse Practitioners) who all work together to provide you with the care you need, when you need it.  We recommend signing up for the patient portal called "MyChart".  Sign up information is provided on this After Visit Summary.  MyChart is used to connect with patients for Virtual Visits (Telemedicine).  Patients are able to view lab/test results, encounter notes, upcoming appointments, etc.  Non-urgent messages can be sent to your provider as well.   To learn more about what you can do with MyChart, go to [email or web address].    Your next appointment:   1 year(s)  The format for your next appointment:   In Person   Provider:   Dr Radford Pax     Other Instructions Thank you for choosing [facility]!

## 2021-06-15 NOTE — Progress Notes (Signed)
Virtual Visit via Video Note   This visit type was conducted due to national recommendations for restrictions regarding the COVID-19 Pandemic (e.g. social distancing) in an effort to limit this patient's exposure and mitigate transmission in our community.  Due to his co-morbid illnesses, this patient is at least at moderate risk for complications without adequate follow up.  This format is felt to be most appropriate for this patient at this time.  All issues noted in this document were discussed and addressed.  A limited physical exam was performed with this format.  Please refer to the patient's chart for his consent to telehealth for [facility].  Video Connection Lost Video connection was lost at > 50% of the duration of this visit, at which time the remainder of the visit was completed via audio only.      Date:  06/15/2021   ID:  Jim Nguyen, DOB 1951/03/04, MRN [id number] The patient was identified using 2 identifiers.  Patient Location: Home Provider Location: Home Office   PCP:  Crist Infante, MD   [facility] Providers Cardiologist:  Peter Martinique, MD Sleep Medicine:  Fransico Him, MD     Evaluation Performed:  Follow-Up Visit  Chief Complaint:  OSA  History of Present Illness:    Jim Nguyen is a 71 y.o. male with  a hx of HLD, PAF and HTN who was referred by Dr. Martinique for home sleep study due to hx of PAF to rule out OSA.  He was concerned that his PAF may be occurring due to undiagnosed OSA.  He had had several episodes of PAF and Dr. Martinique referred him for home sleep study showing severe OSA with an AHI of 55.6/hr and O2 sats as low as 82%.  He underwent CPAP titration to 7cm H2O and his now here for followup.    He is doing well with his CPAP device and thinks that he has gotten used to it.  He tolerates the mask and feels the pressure is adequate.  Since going on CPAP he feels rested in the am and has no significant daytime sleepiness.  He denies any  significant mouth or nasal dryness or nasal congestion.  He does not think that he snores.   He has not used it much over the past 1-2 weeks due to having influenza.    The patient does not have symptoms concerning for COVID-19 infection (fever, chills, cough, or new shortness of breath).    Past Medical History:  Diagnosis Date   Anticoagulant long-term use    Eliquis-- managed by cardiologist   Arthritis    Chronic gout    per pt last episode approx. 2018   GERD (gastroesophageal reflux disease)    History of adenomatous polyp of colon    Hyperlipidemia    Hypertension    followed by cardiology and pcp   (ETT 10-09-2008 in epic, normal without ischemia)   Inguinal hernia of right side without obstruction or gangrene    OSA on CPAP    followed by dr t. Author [place]   PAF (paroxysmal atrial fibrillation) ([place]) 05/2019   cardiologist-- dr Martinique--  long hx palpitations   PONV (postoperative nausea and vomiting)    Past Surgical History:  Procedure Laterality Date   COLONOSCOPY     last one 07/ 2022   INGUINAL HERNIA REPAIR Right 02/12/2021   Procedure: RIGHT INGUINAL HERNIA REPAIR WITH MESH;  Surgeon: Armandina Gemma, MD;  Location: [place];  Service:  General;  Laterality: Right;   LAPAROSCOPIC CHOLECYSTECTOMY  1995     Current Meds  Medication Sig   allopurinol (ZYLOPRIM) 300 MG tablet Take 300 mg by mouth daily.   Ascorbic Acid (VITAMIN C PO) Take 1 tablet by mouth daily.   atorvastatin (LIPITOR) 20 MG tablet Take 20 mg by mouth at bedtime.   cholecalciferol (VITAMIN D3) 25 MCG (1000 UNIT) tablet Take 1,000 Units by mouth daily.   Coenzyme Q10 (CO Q 10 PO) Take 300 mg by mouth daily.   diltiazem (CARDIZEM CD) 360 MG 24 hr capsule TAKE 1 CAPSULE BY MOUTH EVERY DAY   ELIQUIS 5 MG TABS tablet TAKE 1 TABLET BY MOUTH TWICE A DAY   flecainide (TAMBOCOR) 100 MG tablet Take 3 tablets as needed for an episode of Afib. Only take once a day   ranitidine (ZANTAC) 150 MG  capsule Take 150 mg by mouth as needed for heartburn.     Allergies:   Tadalafil   Social History   Tobacco Use   Smoking status: Former    Packs/day: 0.50    Years: 30.00    Pack years: 15.00    Types: Cigars, Cigarettes    Quit date: 01/22/2016    Years since quitting: 5.4   Smokeless tobacco: Never  Vaping Use   Vaping Use: Never used  Substance Use Topics   Alcohol use: Yes    Alcohol/week: 10.0 - 12.0 standard drinks    Types: 10 - 12 Glasses of wine per week    Comment: 1-2 glass per day   Drug use: No     Family Hx: The patient's family history includes Heart disease in his father; Pancreatic cancer in his father. There is no history of Colon cancer.  ROS:   Please see the history of present illness.     All other systems reviewed and are negative.   Prior CV studies:   The following studies were reviewed today:  PAP compliance download  Labs/Other Tests and Data Reviewed:    EKG:  No ECG reviewed.  Recent Labs: 10/19/2020: Platelets 179 02/12/2021: BUN 17; Creatinine, Ser 0.70; Hemoglobin 17.3; Potassium 4.2; Sodium 141   Recent Lipid Panel Lab Results  Component Value Date/Time   CHOL 125 03/29/2011 08:36 AM   TRIG 141.0 03/29/2011 08:36 AM   HDL 33.50 (L) 03/29/2011 08:36 AM   CHOLHDL 4 03/29/2011 08:36 AM   LDLCALC 63 03/29/2011 08:36 AM    Wt Readings from Last 3 Encounters:  06/15/21 225 lb (102.1 kg)  05/24/21 235 lb 12.8 oz (107 kg)  02/12/21 226 lb 6.4 oz (102.7 kg)       Objective:    Vital Signs:  Ht 5\' 10"  (1.778 m)    Wt 225 lb (102.1 kg)    BMI 32.28 kg/m     ASSESSMENT & PLAN:    OSA - The patient is tolerating PAP therapy well without any problems. The PAP download performed by his DME was personally reviewed and interpreted by me today and showed an AHI of 2.5/hr on 7 cm H2O with 70% compliance in using more than 4 hours nightly.  The patient has been using and benefiting from PAP use and will continue to benefit from  therapy.    HTN -His BP is adequately controlled at home -He will continue prescription drug management with Cardizem CD 360 mg daily with as needed refills     COVID-19 Education: The signs and symptoms of COVID-19 were discussed with  the patient and how to seek care for testing (follow up with PCP or arrange E-visit).  The importance of social distancing was discussed today.  Time:   Today, I have spent 20 minutes with the patient with telehealth technology discussing the above problems.     Medication Adjustments/Labs and Tests Ordered: Current medicines are reviewed at length with the patient today.  Concerns regarding medicines are outlined above.   Tests Ordered: No orders of the defined types were placed in this encounter.   Medication Changes: No orders of the defined types were placed in this encounter.   Follow Up:  In Person in 1 year(s)  Signed, Fransico Him, MD  06/15/2021 10:17 AM    [facility]

## 2021-06-16 DIAGNOSIS — M25521 Pain in right elbow: Secondary | ICD-10-CM | POA: Diagnosis not present

## 2021-06-16 DIAGNOSIS — M19021 Primary osteoarthritis, right elbow: Secondary | ICD-10-CM | POA: Diagnosis not present

## 2021-06-16 DIAGNOSIS — M25621 Stiffness of right elbow, not elsewhere classified: Secondary | ICD-10-CM | POA: Diagnosis not present

## 2021-06-29 DIAGNOSIS — M25621 Stiffness of right elbow, not elsewhere classified: Secondary | ICD-10-CM | POA: Diagnosis not present

## 2021-06-29 DIAGNOSIS — M25521 Pain in right elbow: Secondary | ICD-10-CM | POA: Diagnosis not present

## 2021-06-29 DIAGNOSIS — M19021 Primary osteoarthritis, right elbow: Secondary | ICD-10-CM | POA: Diagnosis not present

## 2021-07-02 DIAGNOSIS — M19021 Primary osteoarthritis, right elbow: Secondary | ICD-10-CM | POA: Diagnosis not present

## 2021-07-02 DIAGNOSIS — M25621 Stiffness of right elbow, not elsewhere classified: Secondary | ICD-10-CM | POA: Diagnosis not present

## 2021-07-02 DIAGNOSIS — M25521 Pain in right elbow: Secondary | ICD-10-CM | POA: Diagnosis not present

## 2021-07-06 ENCOUNTER — Other Ambulatory Visit: Payer: Self-pay | Admitting: Orthopaedic Surgery

## 2021-07-06 DIAGNOSIS — M19021 Primary osteoarthritis, right elbow: Secondary | ICD-10-CM | POA: Diagnosis not present

## 2021-07-16 ENCOUNTER — Ambulatory Visit
Admission: RE | Admit: 2021-07-16 | Discharge: 2021-07-16 | Disposition: A | Discharge status: Home / Self Care | Payer: Medicare HMO | Source: Ambulatory Visit | Attending: Orthopaedic Surgery | Admitting: Orthopaedic Surgery

## 2021-07-16 DIAGNOSIS — M25421 Effusion, right elbow: Secondary | ICD-10-CM | POA: Diagnosis not present

## 2021-07-16 DIAGNOSIS — R937 Abnormal findings on diagnostic imaging of other parts of musculoskeletal system: Secondary | ICD-10-CM | POA: Diagnosis not present

## 2021-07-16 DIAGNOSIS — M19021 Primary osteoarthritis, right elbow: Secondary | ICD-10-CM

## 2021-07-16 DIAGNOSIS — M19022 Primary osteoarthritis, left elbow: Secondary | ICD-10-CM | POA: Diagnosis not present

## 2021-07-20 DIAGNOSIS — M19021 Primary osteoarthritis, right elbow: Secondary | ICD-10-CM | POA: Diagnosis not present

## 2021-07-22 ENCOUNTER — Telehealth: Payer: Self-pay

## 2021-07-22 NOTE — Telephone Encounter (Signed)
° °  Pre-operative Risk Assessment    Patient Name: Jim Nguyen  DOB: December 14, 1950 MRN: [id number]      Request for Surgical Clearance    Procedure:   RIGHT ELBOW SCOPE LOOSE BODY EXCISION ULNAR NERVE TRANSPOSITION  Date of Surgery:  Clearance 08/16/21                                 Surgeon:  Ophelia Charter, MD Surgeon's Group or Practice Name:  [facility]  ATTN: Derek Jack Phone number:  [phone] Fax number:  [phone]   Type of Clearance Requested:   - Medical    Type of Anesthesia:   CHOICE   Additional requests/questions:

## 2021-07-22 NOTE — Telephone Encounter (Signed)
° °  Name: Jim Nguyen  DOB: [birth year]  MRN: [id number]   Primary Cardiologist: Peter Martinique, MD  Chart reviewed as part of pre-operative protocol coverage. Patient was contacted 07/22/2021 in reference to pre-operative risk assessment for pending surgery as outlined below.  Jim Nguyen was last seen on 05/24/21 by Dr. Martinique.  Since that day, Jim Nguyen has done well.  He can complete more than 4.0 METS without angina. He does not have a history of ischemic heart disease.   We were not asked for guidance to hold eliquis. In anticipation of this request, I will send a message to our clinical pharmacist for guidance in case a follow up request is received for [facility] hold.  Therefore, based on ACC/AHA guidelines, the patient would be at acceptable risk for the planned procedure without further cardiovascular testing.   The patient was advised that if he develops new symptoms prior to surgery to contact our office to arrange for a follow-up visit, and he verbalized understanding.  I will route this recommendation to the requesting party via Epic fax function and remove from pre-op pool. Please call with questions.  Tami Lin Cayley Pester, PA 07/22/2021, 10:46 AM

## 2021-07-22 NOTE — Telephone Encounter (Signed)
Patient with diagnosis of afib on Eliquis for anticoagulation.    Procedure: right elbow scope loose body excision ulnar nerve transposition Date of procedure: 08/16/21  CHA2DS2-VASc Score = 2  This indicates a 2.2% annual risk of stroke. The patient's score is based upon: CHF History: 0 HTN History: 1 Diabetes History: 0 Stroke History: 0 Vascular Disease History: 0 Age Score: 1 Gender Score: 0   CrCl >172mL/min Platelet count 179K  Patient can hold Eliquis for 1-2 days prior to procedure if needed.

## 2021-07-28 ENCOUNTER — Other Ambulatory Visit: Payer: Medicare HMO

## 2021-08-02 DIAGNOSIS — M19021 Primary osteoarthritis, right elbow: Secondary | ICD-10-CM | POA: Diagnosis not present

## 2021-09-01 DIAGNOSIS — E785 Hyperlipidemia, unspecified: Secondary | ICD-10-CM | POA: Diagnosis not present

## 2021-09-01 DIAGNOSIS — I10 Essential (primary) hypertension: Secondary | ICD-10-CM | POA: Diagnosis not present

## 2021-09-01 DIAGNOSIS — Z125 Encounter for screening for malignant neoplasm of prostate: Secondary | ICD-10-CM | POA: Diagnosis not present

## 2021-09-01 DIAGNOSIS — E559 Vitamin D deficiency, unspecified: Secondary | ICD-10-CM | POA: Diagnosis not present

## 2021-09-01 DIAGNOSIS — R7301 Impaired fasting glucose: Secondary | ICD-10-CM | POA: Diagnosis not present

## 2021-09-03 DIAGNOSIS — E785 Hyperlipidemia, unspecified: Secondary | ICD-10-CM | POA: Diagnosis not present

## 2021-09-03 DIAGNOSIS — Z0000 Encounter for general adult medical examination without abnormal findings: Secondary | ICD-10-CM | POA: Diagnosis not present

## 2021-09-03 DIAGNOSIS — Z1212 Encounter for screening for malignant neoplasm of rectum: Secondary | ICD-10-CM | POA: Diagnosis not present

## 2021-09-03 DIAGNOSIS — I10 Essential (primary) hypertension: Secondary | ICD-10-CM | POA: Diagnosis not present

## 2021-09-03 DIAGNOSIS — I480 Paroxysmal atrial fibrillation: Secondary | ICD-10-CM | POA: Diagnosis not present

## 2021-09-03 DIAGNOSIS — R7301 Impaired fasting glucose: Secondary | ICD-10-CM | POA: Diagnosis not present

## 2021-09-03 DIAGNOSIS — E559 Vitamin D deficiency, unspecified: Secondary | ICD-10-CM | POA: Diagnosis not present

## 2021-09-03 DIAGNOSIS — G4733 Obstructive sleep apnea (adult) (pediatric): Secondary | ICD-10-CM | POA: Diagnosis not present

## 2021-09-03 DIAGNOSIS — Z23 Encounter for immunization: Secondary | ICD-10-CM | POA: Diagnosis not present

## 2021-09-03 DIAGNOSIS — E669 Obesity, unspecified: Secondary | ICD-10-CM | POA: Diagnosis not present

## 2021-09-03 DIAGNOSIS — R82998 Other abnormal findings in urine: Secondary | ICD-10-CM | POA: Diagnosis not present

## 2021-10-15 ENCOUNTER — Telehealth: Payer: Self-pay | Admitting: Cardiology

## 2021-10-15 NOTE — Telephone Encounter (Signed)
Patient with diagnosis of PAF on Eliquis for anticoagulation.   ? ?Procedure: Dental Extraction - Amount of Teeth to be Pulled:  1 Molar and prepare for implant in same area ?Date of procedure: 10/20/21 ? ? ?CHA2DS2-VASc Score = 2  ?This indicates a 2.2% annual risk of stroke. ?The patient's score is based upon: ?CHF History: 0 ?HTN History: 1 ?Diabetes History: 0 ?Stroke History: 0 ?Vascular Disease History: 0 ?Age Score: 1 ?Gender Score: 0 ?  ?CrCl 122 mL/min ? ?Patient does not require pre-op antibiotics for dental procedure. ? ?Per office protocol, patient can hold Eliquis for 1-2 days prior to procedure.   ?

## 2021-10-15 NOTE — Telephone Encounter (Signed)
? ?  Primary Cardiologist: Peter Martinique, MD ? ?Chart reviewed as part of pre-operative protocol coverage. Given past medical history and time since last visit, based on ACC/AHA guidelines, Jim Nguyen would be at acceptable risk for the planned procedure without further cardiovascular testing.  ? ?Patient with diagnosis of PAF on Eliquis for anticoagulation.   ?  ?Procedure: Dental Extraction - Amount of Teeth to be Pulled:  1 Molar and prepare for implant in same area ?Date of procedure: 10/20/21 ?  ?  ?CHA2DS2-VASc Score = 2  ?This indicates a 2.2% annual risk of stroke. ?The patient's score is based upon: ?CHF History: 0 ?HTN History: 1 ?Diabetes History: 0 ?Stroke History: 0 ?Vascular Disease History: 0 ?Age Score: 1 ?Gender Score: 0 ?  ?CrCl 122 mL/min ?  ?Patient does not require pre-op antibiotics for dental procedure. ?  ?Per office protocol, patient can hold Eliquis for 1-2 days prior to procedure. ? ?I will route this recommendation to the requesting party via Epic fax function and remove from pre-op pool. ? ?Please call with questions. ? ?Jim Ng. Zeniyah Peaster NP-C ? ?  ?10/15/2021, 1:05 PM ?[facility] ?[address] ?Office [phone] Fax [phone] ? ? ? ? ?

## 2021-10-15 NOTE — Telephone Encounter (Signed)
? ?  Pre-operative Risk Assessment  ?  ?Patient Name: Jim Nguyen  ?DOB: 1951/03/26 ?MRN: [id number]  ? ? ? ?Request for Surgical Clearance   ? ?Procedure:  Dental Extraction - Amount of Teeth to be Pulled:  1 Molar and prepare for implant in same area ? ?Date of Surgery:  Clearance 10/20/21                              ?   ?Surgeon:  Dr. Larkin Ina Drab  ?Surgeon's Group or Practice Name:  [facility] ?Phone number:  [phone] ?Fax number:  [phone] ?  ?Type of Clearance Requested:   ?- Pharmacy:  Hold Apixaban (Eliquis) TBD by Cardiololgy ?  ?Type of Anesthesia:  Not Indicated ?  ?Additional requests/questions:   ? ?Signed, ?Johnna Acosta   ?10/15/2021, 9:27 AM   ?

## 2021-10-25 ENCOUNTER — Encounter: Payer: Self-pay | Admitting: Cardiology

## 2021-10-26 DIAGNOSIS — R1084 Generalized abdominal pain: Secondary | ICD-10-CM | POA: Diagnosis not present

## 2021-10-26 DIAGNOSIS — N1330 Unspecified hydronephrosis: Secondary | ICD-10-CM | POA: Diagnosis not present

## 2021-10-26 DIAGNOSIS — R310 Gross hematuria: Secondary | ICD-10-CM | POA: Diagnosis not present

## 2021-10-26 DIAGNOSIS — Z9049 Acquired absence of other specified parts of digestive tract: Secondary | ICD-10-CM | POA: Diagnosis not present

## 2021-10-29 DIAGNOSIS — R1084 Generalized abdominal pain: Secondary | ICD-10-CM | POA: Diagnosis not present

## 2021-10-29 DIAGNOSIS — R310 Gross hematuria: Secondary | ICD-10-CM | POA: Diagnosis not present

## 2021-10-29 DIAGNOSIS — N3289 Other specified disorders of bladder: Secondary | ICD-10-CM | POA: Diagnosis not present

## 2021-11-11 NOTE — Progress Notes (Unsigned)
Cardiology Office Note   Date:  11/11/2021   ID:  Nussen, Pullin 1950-12-06, MRN [id number]  PCP:  Crist Infante, MD  Cardiologist:   Sarp Vernier Martinique, MD   No chief complaint on file.     History of Present Illness: Jim Nguyen is a 71 y.o. male who presents for follow up Afib. He has  a hx of HTN, HLD and atrial fibrillation.  Patient was previously seen by Dr. Harrell Gave in December 2020 for increased palpitation.  7 day event monitor at the time showed frequent but brief runs of PAT.  Echocardiogram was normal.  Due to persistent chest pain, he sent in some apple watch tracing that appears to show atrial fibrillation.  Diltiazem was increased to 240 mg daily.  He was seen on 07/08/2019, at which time we discussed with the patient pros and cons of anticoagulation and he preferred to avoid systemic anticoagulation for the time being. His CHA2DS2-Vasc score is 2 (age, HTN).  On 09/11/2019, he has went back into atrial fibrillation from the day before.  This was confirmed the both on EKG and his apple watch.  Given his CHA2DS2-Vasc score of 2,  Eliquis 5 mg twice daily was recommended.   His diltiazem was increased  to 360 mg daily.  Given his snoring, a home sleep study was ordered. He did later have some hematuria. He was referred to Urology for evaluation. He reports he had cystoscopy which was negative. Renal US showed a left kidney stone. Since then he states he has no further hematuria.    He did have a sleep study last year confirming sleep apnea and he is now on CPAP. He has been seen in the Afib clinic in November and on last visit had converted to NSR. Was given metoprolol prn but never started it. He  has been on CPAP nightly.  Followed by Dr Radford Pax for sleep apnea.   He notes he has been in Afib since Thursday night. Thinks stress may be a trigger. No chest pain or dyspnea. Typically HR in the 90s with Afib. Tolerates it OK. Did have hernia surgery this fall. With inactivity with  this he has gained weight. Notes he typically has Afib every 6 weeks lasting less than 12 hours.  Past Medical History:  Diagnosis Date   Anticoagulant long-term use    Eliquis-- managed by cardiologist   Arthritis    Chronic gout    per pt last episode approx. 2018   GERD (gastroesophageal reflux disease)    History of adenomatous polyp of colon    Hyperlipidemia    Hypertension    followed by cardiology and pcp   (ETT 10-09-2008 in epic, normal without ischemia)   Inguinal hernia of right side without obstruction or gangrene    OSA on CPAP    followed by dr t. turner   PAF (paroxysmal atrial fibrillation) ([place]) 05/2019   cardiologist-- dr Martinique--  long hx palpitations   PONV (postoperative nausea and vomiting)     Past Surgical History:  Procedure Laterality Date   COLONOSCOPY     last one 07/ 2022   INGUINAL HERNIA REPAIR Right 02/12/2021   Procedure: RIGHT INGUINAL HERNIA REPAIR WITH MESH;  Surgeon: Armandina Gemma, MD;  Location: [facility];  Service: General;  Laterality: Right;   LAPAROSCOPIC CHOLECYSTECTOMY  1995     Current Outpatient Medications  Medication Sig Dispense Refill   allopurinol (ZYLOPRIM) 300 MG tablet Take 300 mg  by mouth daily.     Ascorbic Acid (VITAMIN C PO) Take 1 tablet by mouth daily.     atorvastatin (LIPITOR) 20 MG tablet Take 20 mg by mouth at bedtime.     cholecalciferol (VITAMIN D3) 25 MCG (1000 UNIT) tablet Take 1,000 Units by mouth daily.     Coenzyme Q10 (CO Q 10 PO) Take 300 mg by mouth daily.     diltiazem (CARDIZEM CD) 360 MG 24 hr capsule TAKE 1 CAPSULE BY MOUTH EVERY DAY 90 capsule 3   ELIQUIS 5 MG TABS tablet TAKE 1 TABLET BY MOUTH TWICE A DAY 180 tablet 3   flecainide (TAMBOCOR) 100 MG tablet Take 3 tablets as needed for an episode of Afib. Only take once a day 30 tablet 6   metoprolol tartrate (LOPRESSOR) 25 MG tablet Take 25 mg by mouth as directed. Per cardiologist take as needed for Afib (Patient not taking:  Reported on 06/15/2021)     ranitidine (ZANTAC) 150 MG capsule Take 150 mg by mouth as needed for heartburn.     No current facility-administered medications for this visit.    Allergies:   Tadalafil    Social History:  The patient  reports that he quit smoking about 5 years ago. His smoking use included cigars and cigarettes. He has a 15.00 pack-year smoking history. He has never used smokeless tobacco. He reports current alcohol use of about 10.0 - 12.0 standard drinks of alcohol per week. He reports that he does not use drugs.   Family History:  The patient's family history includes Heart disease in his father; Pancreatic cancer in his father.    ROS:  Please see the history of present illness.   Otherwise, review of systems are positive for none.   All other systems are reviewed and negative.    PHYSICAL EXAM: VS:  There were no vitals taken for this visit. , BMI There is no height or weight on file to calculate BMI. GEN: Well nourished, well developed, in no acute distress HEENT: normal Neck: no JVD, carotid bruits, or masses Cardiac: IRRR; no murmurs, rubs, or gallops,no edema  Respiratory:  clear to auscultation bilaterally, normal work of breathing GI: soft, nontender, nondistended, + BS MS: no deformity or atrophy Skin: warm and dry, no rash Neuro:  Strength and sensation are intact Psych: euthymic mood, full affect   EKG:  EKG is not ordered today. The ekg ordered today demonstrates N/A   Recent Labs: 02/12/2021: BUN 17; Creatinine, Ser 0.70; Hemoglobin 17.3; Potassium 4.2; Sodium 141    Lipid Panel    Component Value Date/Time   CHOL 125 03/29/2011 0836   TRIG 141.0 03/29/2011 0836   HDL 33.50 (L) 03/29/2011 0836   CHOLHDL 4 03/29/2011 0836   VLDL 28.2 03/29/2011 0836   LDLCALC 63 03/29/2011 0836    Dated 08/31/20: cholesterol 126, triglycerides 152, HDL 41, LDL 55. A1c 5.5%. CMET and TSH normal. Dated 02/12/21: CBC normal. Dated 09/01/21: cholesterol 118,  triglycerides 128, HDL 40, LDL 52. A1c 5.7%. CMET, TSH and PSA normal.   Wt Readings from Last 3 Encounters:  06/15/21 225 lb (102.1 kg)  05/24/21 235 lb 12.8 oz (107 kg)  02/12/21 226 lb 6.4 oz (102.7 kg)      Other studies Reviewed: Additional studies/ records that were reviewed today include:  Echo 04/15/2019 IMPRESSIONS     1. Left ventricular ejection fraction, by visual estimation, is 65 to  70%. The left ventricle has hyperdynamic function. There is  no left  ventricular hypertrophy.   2. Left ventricular diastolic parameters are consistent with Grade I  diastolic dysfunction (impaired relaxation).   3. Global right ventricle has normal systolic function.The right  ventricular size is normal. No increase in right ventricular wall  thickness.   4. Left atrial size was mildly dilated.   5. Right atrial size was normal.   6. Mild mitral annular calcification.   7. The mitral valve is abnormal. Trace mitral valve regurgitation.   8. The tricuspid valve is normal in structure. Tricuspid valve  regurgitation is mild.   9. The aortic valve is tricuspid. Aortic valve regurgitation is not  visualized. Mild aortic valve sclerosis without stenosis.  10. The pulmonic valve was normal in structure. Pulmonic valve  regurgitation is trivial.     ASSESSMENT AND PLAN:  1.  PAF: Still having episodes despite being compliant with CPAP.  He is tolerating Cardizem and Eliquis well. Rate is controlled.   Very encouraged with lifestyle modification. We discussed options for further therapy including maintenance AAD therapy. I think it would be worth trying a "pill in a pocket: strategy with Flecainide 300 mg orally prn AFib. If he has more frequent or sustained arrhythmia we may have to consider maintenance therapy.   Hypertension: Blood pressure controlled.    Hyperlipidemia: On Lipitor.   4.   OSA now on CPAP   Current medicines are reviewed at length with the patient today.  The  patient does not have concerns regarding medicines.  The following changes have been made:  Flecainide 300 mg prn Afib as "pill in a pocket"   Labs/ tests ordered today include:  No orders of the defined types were placed in this encounter.        Disposition:   FU with me in 6 months  Signed, Tigerlily Christine Martinique, MD  11/11/2021 8:12 AM    [facility] [address] [place], [place], [address] Phone [phone], Fax [phone]

## 2021-11-15 ENCOUNTER — Ambulatory Visit: Payer: Medicare HMO | Admitting: Cardiology

## 2021-11-15 ENCOUNTER — Encounter: Payer: Self-pay | Admitting: Cardiology

## 2021-11-15 VITALS — BP 122/62 | HR 66 | Ht 70.0 in | Wt 225.0 lb

## 2021-11-15 DIAGNOSIS — I480 Paroxysmal atrial fibrillation: Secondary | ICD-10-CM | POA: Diagnosis not present

## 2021-11-15 DIAGNOSIS — I10 Essential (primary) hypertension: Secondary | ICD-10-CM | POA: Diagnosis not present

## 2021-11-15 DIAGNOSIS — G4733 Obstructive sleep apnea (adult) (pediatric): Secondary | ICD-10-CM

## 2021-11-15 MED ORDER — FLECAINIDE ACETATE 100 MG PO TABS: 100.0000 mg | ORAL_TABLET | Freq: Two times a day (BID) | ORAL | 3 refills | Status: DC

## 2021-11-15 NOTE — Patient Instructions (Signed)
Medication Instructions:  START taking flecainide '100mg'$  twice daily  *If you need a refill on your cardiac medications before your next appointment, please call your pharmacy*  Follow-Up: At [facility], you and your health needs are our priority.  As part of our continuing mission to provide you with exceptional heart care, we have created designated Provider Care Teams.  These Care Teams include your primary Cardiologist (physician) and Advanced Practice Providers (APPs -  Physician Assistants and Nurse Practitioners) who all work together to provide you with the care you need, when you need it.  We recommend signing up for the patient portal called "MyChart".  Sign up information is provided on this After Visit Summary.  MyChart is used to connect with patients for Virtual Visits (Telemedicine).  Patients are able to view lab/test results, encounter notes, upcoming appointments, etc.  Non-urgent messages can be sent to your provider as well.   To learn more about what you can do with MyChart, go to [email or web address].    Your next appointment:   6 month(s)  The format for your next appointment:   In Person  Provider:   Peter Martinique, MD {

## 2022-01-12 DIAGNOSIS — T1502XA Foreign body in cornea, left eye, initial encounter: Secondary | ICD-10-CM | POA: Diagnosis not present

## 2022-01-12 DIAGNOSIS — H10022 Other mucopurulent conjunctivitis, left eye: Secondary | ICD-10-CM | POA: Diagnosis not present

## 2022-01-13 DIAGNOSIS — H10022 Other mucopurulent conjunctivitis, left eye: Secondary | ICD-10-CM | POA: Diagnosis not present

## 2022-01-13 DIAGNOSIS — T1502XD Foreign body in cornea, left eye, subsequent encounter: Secondary | ICD-10-CM | POA: Diagnosis not present

## 2022-03-04 ENCOUNTER — Telehealth: Payer: Self-pay | Admitting: Cardiology

## 2022-03-04 MED ORDER — APIXABAN 5 MG PO TABS: 5.0000 mg | ORAL_TABLET | Freq: Two times a day (BID) | ORAL | 0 refills | Status: DC

## 2022-03-04 NOTE — Telephone Encounter (Signed)
Prescription refill request for Eliquis received. Indication:Afib Last office visit:6/12 Scr:0.7 Age: 71 Weight:102.1 kg  Prescription refilled

## 2022-03-04 NOTE — Telephone Encounter (Signed)
*  STAT* If patient is at the pharmacy, call can be transferred to refill team.   1. Which medications need to be refilled? (please list name of each medication and dose if known) Eliquis  2. Which pharmacy/location (including street and city if local pharmacy) is medication to be sent to?[facility] [address] [facility] [address]  3. Do they need a 30 day or 90 day supply? 90 days and refills- need this call in today if possible please

## 2022-03-14 DIAGNOSIS — T1502XD Foreign body in cornea, left eye, subsequent encounter: Secondary | ICD-10-CM | POA: Diagnosis not present

## 2022-03-14 DIAGNOSIS — H10022 Other mucopurulent conjunctivitis, left eye: Secondary | ICD-10-CM | POA: Diagnosis not present

## 2022-03-14 DIAGNOSIS — H5213 Myopia, bilateral: Secondary | ICD-10-CM | POA: Diagnosis not present

## 2022-04-13 DIAGNOSIS — J1189 Influenza due to unidentified influenza virus with other manifestations: Secondary | ICD-10-CM | POA: Diagnosis not present

## 2022-04-15 ENCOUNTER — Encounter: Payer: Self-pay | Admitting: Cardiology

## 2022-05-03 NOTE — Progress Notes (Signed)
Cardiology Office Note   Date:  05/06/2022   ID:  Jim Nguyen 1950/10/31, MRN [id number]  PCP:  Crist Infante, MD  Cardiologist:   Gavon Majano Martinique, MD   Chief Complaint  Patient presents with   Atrial Fibrillation      History of Present Illness: Jim Nguyen is a 71 y.o. male who presents for follow up Afib. He has  a hx of HTN, HLD and atrial fibrillation.  Patient was previously seen  in December 2020 for increased palpitation.  7 day event monitor at the time showed frequent but brief runs of PAT.  Echocardiogram was normal. Due to persistent symptoms he sent in some apple watch tracings that appeared to show atrial fibrillation.  Diltiazem was increased to 240 mg daily.  .  On 09/11/2019, he has went back into atrial fibrillation from the day before.  This was confirmed the both on EKG and his apple watch.  Given his CHA2DS2-Vasc score of 2,  Eliquis 5 mg twice daily was recommended.   His diltiazem was increased  to 360 mg daily.  Given his snoring, a home sleep study was ordered. He did later have some hematuria. He was referred to Urology for evaluation. He reports he had cystoscopy which was negative. Renal US showed a left kidney stone. Since then he states he has no further hematuria.    He did have a sleep study last year confirming sleep apnea and he is now on CPAP.  He  has been on CPAP nightly.  Followed by Dr Radford Pax for sleep apnea.   On his last visit we placed him on Flecainide to help with Afib. Since he has been on this he hasn't had any Afib. He is active playing golf. Drinks a couple of glasses of wine daily. Unfortunately his wife passed away this year with acute PE following ankle fracture.    Past Medical History:  Diagnosis Date   Anticoagulant long-term use    Eliquis-- managed by cardiologist   Arthritis    Chronic gout    per pt last episode approx. 2018   GERD (gastroesophageal reflux disease)    History of adenomatous polyp of colon     Hyperlipidemia    Hypertension    followed by cardiology and pcp   (ETT 10-09-2008 in epic, normal without ischemia)   Inguinal hernia of right side without obstruction or gangrene    OSA on CPAP    followed by dr t. turner   PAF (paroxysmal atrial fibrillation) ([place]) 05/2019   cardiologist-- dr Martinique--  long hx palpitations   PONV (postoperative nausea and vomiting)     Past Surgical History:  Procedure Laterality Date   COLONOSCOPY     last one 07/ 2022   INGUINAL HERNIA REPAIR Right 02/12/2021   Procedure: RIGHT INGUINAL HERNIA REPAIR WITH MESH;  Surgeon: Armandina Gemma, MD;  Location: [facility];  Service: General;  Laterality: Right;   LAPAROSCOPIC CHOLECYSTECTOMY  1995     Current Outpatient Medications  Medication Sig Dispense Refill   allopurinol (ZYLOPRIM) 300 MG tablet Take 300 mg by mouth daily.     apixaban (ELIQUIS) 5 MG TABS tablet Take 1 tablet (5 mg total) by mouth 2 (two) times daily. 180 tablet 0   Ascorbic Acid (VITAMIN C PO) Take 1 tablet by mouth daily.     atorvastatin (LIPITOR) 20 MG tablet Take 20 mg by mouth at bedtime.     cholecalciferol (VITAMIN D3)  25 MCG (1000 UNIT) tablet Take 1,000 Units by mouth daily.     Coenzyme Q10 (CO Q 10 PO) Take 300 mg by mouth daily.     diltiazem (CARDIZEM CD) 360 MG 24 hr capsule TAKE 1 CAPSULE BY MOUTH EVERY DAY 90 capsule 3   flecainide (TAMBOCOR) 100 MG tablet Take 1 tablet (100 mg total) by mouth 2 (two) times daily. Take 3 tablets as needed for an episode of Afib. Only take once a day 180 tablet 3   ranitidine (ZANTAC) 150 MG capsule Take 150 mg by mouth as needed for heartburn.     No current facility-administered medications for this visit.    Allergies:   Tadalafil    Social History:  The patient  reports that he quit smoking about 6 years ago. His smoking use included cigars and cigarettes. He has a 15.00 pack-year smoking history. He has never used smokeless tobacco. He reports current alcohol  use of about 10.0 - 12.0 standard drinks of alcohol per week. He reports that he does not use drugs.   Family History:  The patient's family history includes Heart disease in his father; Pancreatic cancer in his father.    ROS:  Please see the history of present illness.   Otherwise, review of systems are positive for none.   All other systems are reviewed and negative.    PHYSICAL EXAM: VS:  BP 126/64   Pulse 69   Ht '5\' 10"'$  (1.778 m)   Wt 223 lb (101.2 kg)   SpO2 98%   BMI 32.00 kg/m  , BMI Body mass index is 32 kg/m. GEN: Well nourished, well developed, in no acute distress HEENT: normal Neck: no JVD, carotid bruits, or masses Cardiac: IRRR; no murmurs, rubs, or gallops,no edema  Respiratory:  clear to auscultation bilaterally, normal work of breathing GI: soft, nontender, nondistended, + BS MS: no deformity or atrophy Skin: warm and dry, no rash Neuro:  Strength and sensation are intact Psych: euthymic mood, full affect   EKG:  EKG is not ordered today.    Recent Labs: No results found for requested labs within last 365 days.    Lipid Panel    Component Value Date/Time   CHOL 125 03/29/2011 0836   TRIG 141.0 03/29/2011 0836   HDL 33.50 (L) 03/29/2011 0836   CHOLHDL 4 03/29/2011 0836   VLDL 28.2 03/29/2011 0836   LDLCALC 63 03/29/2011 0836    Dated 08/31/20: cholesterol 126, triglycerides 152, HDL 41, LDL 55. A1c 5.5%. CMET and TSH normal. Dated 02/12/21: CBC normal. Dated 09/01/21: cholesterol 118, triglycerides 128, HDL 40, LDL 52. A1c 5.7%. CMET, TSH and PSA normal.   Wt Readings from Last 3 Encounters:  05/06/22 223 lb (101.2 kg)  11/15/21 225 lb (102.1 kg)  06/15/21 225 lb (102.1 kg)      Other studies Reviewed: Additional studies/ records that were reviewed today include:  Echo 04/15/2019 IMPRESSIONS     1. Left ventricular ejection fraction, by visual estimation, is 65 to  70%. The left ventricle has hyperdynamic function. There is no left   ventricular hypertrophy.   2. Left ventricular diastolic parameters are consistent with Grade I  diastolic dysfunction (impaired relaxation).   3. Global right ventricle has normal systolic function.The right  ventricular size is normal. No increase in right ventricular wall  thickness.   4. Left atrial size was mildly dilated.   5. Right atrial size was normal.   6. Mild mitral annular calcification.  7. The mitral valve is abnormal. Trace mitral valve regurgitation.   8. The tricuspid valve is normal in structure. Tricuspid valve  regurgitation is mild.   9. The aortic valve is tricuspid. Aortic valve regurgitation is not  visualized. Mild aortic valve sclerosis without stenosis.  10. The pulmonic valve was normal in structure. Pulmonic valve  regurgitation is trivial.     ASSESSMENT AND PLAN:  1.  PAF: on diltiazem for rate control and Eliquis for anticoagulation. He has a great response to Flecainide with no recurrent Afib.  continue 100 mg  bid.  Encourage continued efforts at weight loss. If he has significant break through on AAD therapy then I would refer for ablation.    Hypertension: Blood pressure controlled.    Hyperlipidemia: On Lipitor.   4.   OSA now on CPAP   Current medicines are reviewed at length with the patient today.  The patient does not have concerns regarding medicines.  The following changes have been made: none   Labs/ tests ordered today include:   No orders of the defined types were placed in this encounter.        Disposition:   FU with me in 6 months  Signed, Amaurie Wandel Martinique, MD  05/06/2022 8:27 AM    [facility] [address], [place], [place], [address] Phone [phone], Fax [phone]

## 2022-05-06 ENCOUNTER — Ambulatory Visit: Payer: Medicare HMO | Attending: Cardiology | Admitting: Cardiology

## 2022-05-06 ENCOUNTER — Encounter: Payer: Self-pay | Admitting: Cardiology

## 2022-05-06 VITALS — BP 126/64 | HR 69 | Ht 70.0 in | Wt 223.0 lb

## 2022-05-06 DIAGNOSIS — E7800 Pure hypercholesterolemia, unspecified: Secondary | ICD-10-CM | POA: Diagnosis not present

## 2022-05-06 DIAGNOSIS — I10 Essential (primary) hypertension: Secondary | ICD-10-CM | POA: Diagnosis not present

## 2022-05-06 DIAGNOSIS — I480 Paroxysmal atrial fibrillation: Secondary | ICD-10-CM

## 2022-06-08 DIAGNOSIS — L812 Freckles: Secondary | ICD-10-CM | POA: Diagnosis not present

## 2022-06-08 DIAGNOSIS — D2272 Melanocytic nevi of left lower limb, including hip: Secondary | ICD-10-CM | POA: Diagnosis not present

## 2022-06-08 DIAGNOSIS — D485 Neoplasm of uncertain behavior of skin: Secondary | ICD-10-CM | POA: Diagnosis not present

## 2022-06-08 DIAGNOSIS — D2271 Melanocytic nevi of right lower limb, including hip: Secondary | ICD-10-CM | POA: Diagnosis not present

## 2022-06-08 DIAGNOSIS — L821 Other seborrheic keratosis: Secondary | ICD-10-CM | POA: Diagnosis not present

## 2022-06-08 DIAGNOSIS — D225 Melanocytic nevi of trunk: Secondary | ICD-10-CM | POA: Diagnosis not present

## 2022-06-09 ENCOUNTER — Other Ambulatory Visit: Payer: Self-pay | Admitting: Cardiology

## 2022-06-09 DIAGNOSIS — I480 Paroxysmal atrial fibrillation: Secondary | ICD-10-CM

## 2022-06-09 MED ORDER — APIXABAN 5 MG PO TABS: 5.0000 mg | ORAL_TABLET | Freq: Two times a day (BID) | ORAL | 1 refills | Status: DC

## 2022-06-09 NOTE — Telephone Encounter (Signed)
*  STAT* If patient is at the pharmacy, call can be transferred to refill team.   1. Which medications need to be refilled? (please list name of each medication and dose if known)   apixaban (ELIQUIS) 5 MG TABS tablet    2. Which pharmacy/location (including street and city if local pharmacy) is medication to be sent to? [id number] [place] [place] [facility] [address]  3. Do they need a 30 day or 90 day supply?  90 day

## 2022-06-09 NOTE — Telephone Encounter (Signed)
Prescription refill request for Eliquis received. Indication: Afib  Last office visit: 05/06/22 (Martinique)  Scr: 1.0 ( 10/29/21)  Age: 72 Weight: 101.2kg  Appropriate dose and refill sent to requested pharmacy.

## 2022-06-13 DIAGNOSIS — G4733 Obstructive sleep apnea (adult) (pediatric): Secondary | ICD-10-CM | POA: Diagnosis not present

## 2022-06-21 ENCOUNTER — Ambulatory Visit: Payer: Medicare HMO | Attending: Cardiology | Admitting: Cardiology

## 2022-06-21 ENCOUNTER — Encounter: Payer: Self-pay | Admitting: Cardiology

## 2022-06-21 VITALS — BP 122/64 | HR 62 | Ht 70.0 in | Wt 230.6 lb

## 2022-06-21 DIAGNOSIS — I10 Essential (primary) hypertension: Secondary | ICD-10-CM | POA: Diagnosis not present

## 2022-06-21 DIAGNOSIS — G4733 Obstructive sleep apnea (adult) (pediatric): Secondary | ICD-10-CM

## 2022-06-21 NOTE — Progress Notes (Signed)
Date:  06/21/2022   ID:  Jim Nguyen, DOB [birth year], MRN [id number] The patient was identified using 2 identifiers.  PCP:  Crist Infante, MD   [facility] Providers Cardiologist:  Peter Martinique, MD Sleep Medicine:  Fransico Him, MD     Evaluation Performed:  Follow-Up Visit  Chief Complaint:  OSA  History of Present Illness:    Jim Nguyen is a 72 y.o. male with  a hx of HLD, PAF and HTN who was referred by Dr. Martinique for home sleep study due to hx of PAF to rule out OSA.  He was concerned that his PAF may be occurring due to undiagnosed OSA.  He had had several episodes of PAF and Dr. Martinique referred him for home sleep study showing severe OSA with an AHI of 55.6/hr and O2 sats as low as 82%.  He underwent CPAP titration to 7cm H2O and his now here for followup.    He is doing well with his PAP device and thinks that he has gotten used to it.  He tolerates the mask and feels the pressure is adequate.  He denies any significant mouth or nasal dryness or nasal congestion.  He does not think that he snores.    Past Medical History:  Diagnosis Date   Anticoagulant long-term use    Eliquis-- managed by cardiologist   Arthritis    Chronic gout    per pt last episode approx. 2018   GERD (gastroesophageal reflux disease)    History of adenomatous polyp of colon    Hyperlipidemia    Hypertension    followed by cardiology and pcp   (ETT 10-09-2008 in epic, normal without ischemia)   Inguinal hernia of right side without obstruction or gangrene    OSA on CPAP    followed by dr t. Rasean Joos   PAF (paroxysmal atrial fibrillation) ([place]) 05/2019   cardiologist-- dr Martinique--  long hx palpitations   PONV (postoperative nausea and vomiting)    Past Surgical History:  Procedure Laterality Date   COLONOSCOPY     last one 07/ 2022   INGUINAL HERNIA REPAIR Right 02/12/2021   Procedure: RIGHT INGUINAL HERNIA REPAIR WITH MESH;  Surgeon: Armandina Gemma, MD;  Location: [place];  Service: General;  Laterality: Right;   LAPAROSCOPIC CHOLECYSTECTOMY  1995     Current Meds  Medication Sig   allopurinol (ZYLOPRIM) 300 MG tablet Take 300 mg by mouth daily.   apixaban (ELIQUIS) 5 MG TABS tablet Take 1 tablet (5 mg total) by mouth 2 (two) times daily.   Ascorbic Acid (VITAMIN C PO) Take 1 tablet by mouth daily.   atorvastatin (LIPITOR) 20 MG tablet Take 20 mg by mouth at bedtime.   cholecalciferol (VITAMIN D3) 25 MCG (1000 UNIT) tablet Take 1,000 Units by mouth daily.   Coenzyme Q10 (CO Q 10 PO) Take 300 mg by mouth daily.   diltiazem (CARDIZEM CD) 360 MG 24 hr capsule TAKE 1 CAPSULE BY MOUTH EVERY DAY   flecainide (TAMBOCOR) 100 MG tablet Take 1 tablet (100 mg total) by mouth 2 (two) times daily. Take 3 tablets as needed for an episode of Afib. Only take once a day   ranitidine (ZANTAC) 150 MG capsule Take 150 mg by mouth as needed for heartburn.     Allergies:   Tadalafil   Social History   Tobacco Use   Smoking status: Former    Packs/day: 0.50    Years: 30.00  Total pack years: 15.00    Types: Cigars, Cigarettes    Quit date: 01/22/2016    Years since quitting: 6.4   Smokeless tobacco: Never  Vaping Use   Vaping Use: Never used  Substance Use Topics   Alcohol use: Yes    Alcohol/week: 10.0 - 12.0 standard drinks of alcohol    Types: 10 - 12 Glasses of wine per week    Comment: 1-2 glass per day   Drug use: No     Family Hx: The patient's family history includes Heart disease in his father; Pancreatic cancer in his father. There is no history of Colon cancer.  ROS:   Please see the history of present illness.     All other systems reviewed and are negative.   Prior CV studies:   The following studies were reviewed today:  PAP compliance download  Labs/Other Tests and Data Reviewed:    EKG:  No ECG reviewed.  Recent Labs: No results found for requested labs within last 365 days.   Recent Lipid Panel Lab Results  Component  Value Date/Time   CHOL 125 03/29/2011 08:36 AM   TRIG 141.0 03/29/2011 08:36 AM   HDL 33.50 (L) 03/29/2011 08:36 AM   CHOLHDL 4 03/29/2011 08:36 AM   LDLCALC 63 03/29/2011 08:36 AM    Wt Readings from Last 3 Encounters:  06/21/22 230 lb 9.6 oz (104.6 kg)  05/06/22 223 lb (101.2 kg)  11/15/21 225 lb (102.1 kg)       Objective:    Vital Signs:  BP 122/64   Pulse 62   Ht '5\' 10"'$  (1.778 m)   Wt 230 lb 9.6 oz (104.6 kg)   SpO2 97%   BMI 33.09 kg/m    GEN: Well nourished, well developed in no acute distress HEENT: Normal NECK: No JVD; No carotid bruits LYMPHATICS: No lymphadenopathy CARDIAC:RRR, no murmurs, rubs, gallops RESPIRATORY:  Clear to auscultation without rales, wheezing or rhonchi  ABDOMEN: Soft, non-tender, non-distended MUSCULOSKELETAL:  No edema; No deformity  SKIN: Warm and dry NEUROLOGIC:  Alert and oriented x 3 PSYCHIATRIC:  Normal affect   ASSESSMENT & PLAN:    OSA - The patient is tolerating PAP therapy well without any problems. The PAP download performed by his DME was personally reviewed and interpreted by me today and showed an AHI of 2 /hr on 7 cm H2O with 57 % compliance in using more than 4 hours nightly.  The patient has been using and benefiting from PAP use and will continue to benefit from therapy.  -his wife passed away recently so he has not been sleeping well like he used to -I encouraged him to be more compliant with his device  HTN -BP is adequately controlled -Continue prescription drug management Cardizem CD 360 mg daily with as needed refills     Medication Adjustments/Labs and Tests Ordered: Current medicines are reviewed at length with the patient today.  Concerns regarding medicines are outlined above.   Tests Ordered: No orders of the defined types were placed in this encounter.   Medication Changes: No orders of the defined types were placed in this encounter.   Follow Up:  In Person in 1 year(s)  Signed, Fransico Him, MD   06/21/2022 11:09 AM    [place]

## 2022-06-21 NOTE — Patient Instructions (Signed)
Medication Instructions:  Your physician recommends that you continue on your current medications as directed. Please refer to the Current Medication list given to you today.  Labwork: NONE  Testing/Procedures: NONE  Follow-Up: Your physician wants you to follow-up in: 12 months with Dr. Turner. You will receive a reminder letter in the mail two months in advance. If you don't receive a letter, please call our office to schedule the follow-up appointment.   If you need a refill on your cardiac medications before your next appointment, please call your pharmacy.    

## 2022-07-06 ENCOUNTER — Other Ambulatory Visit: Payer: Self-pay | Admitting: Cardiology

## 2022-07-29 ENCOUNTER — Ambulatory Visit: Payer: Self-pay

## 2022-07-29 DIAGNOSIS — K625 Hemorrhage of anus and rectum: Secondary | ICD-10-CM | POA: Diagnosis not present

## 2022-07-29 DIAGNOSIS — K648 Other hemorrhoids: Secondary | ICD-10-CM | POA: Diagnosis not present

## 2022-07-29 DIAGNOSIS — D72825 Bandemia: Secondary | ICD-10-CM | POA: Diagnosis not present

## 2022-07-29 DIAGNOSIS — I10 Essential (primary) hypertension: Secondary | ICD-10-CM | POA: Diagnosis not present

## 2022-07-29 DIAGNOSIS — I480 Paroxysmal atrial fibrillation: Secondary | ICD-10-CM | POA: Diagnosis not present

## 2022-07-29 NOTE — Telephone Encounter (Signed)
Chief Complaint: Constipation and bleeding from rectum Symptoms: Above Frequency: 3 days Pertinent Negatives: Patient denies  Disposition: '[x]'$ ED /'[]'$ Urgent Care (no appt availability in office) / '[]'$ Appointment(In office/virtual)/ '[]'$  [place] [facility]/ '[]'$ Home Care/ '[]'$ Refused Recommended Disposition /'[]'$ [place] [facility]/ '[]'$  Follow-up with PCP Additional Notes: Pt reports no BM for 2 days. Pt states that he can feel the stool, but it won't come out. Pt also reports a fair amount of rectal bleeding. Pt is on blood thinners. Pt thinks he is impacted. Pt will go to ED for care.    Reason for Disposition  Last bowel movement (BM) > 4 days ago  SEVERE rectal bleeding (large blood clots; constant or on and off bleeding)  [1] MODERATE rectal bleeding (small blood clots, passing blood without stool, or toilet water turns red) AND [2] more than once a day  Answer Assessment - Initial Assessment Questions 1. STOOL PATTERN OR FREQUENCY: "How often do you have a bowel movement (BM)?"  (Normal range: 3 times a day to every 3 days)  "When was your last BM?"       Day before yesterday 2. STRAINING: "Do you have to strain to have a BM?"      yes 3. RECTAL PAIN: "Does your rectum hurt when the stool comes out?" If Yes, ask: "Do you have hemorrhoids? How bad is the pain?"  (Scale 1-10; or mild, moderate, severe)     Does not come out 4. STOOL COMPOSITION: "Are the stools hard?"      Won't come out 5. BLOOD ON STOOLS: "Has there been any blood on the toilet tissue or on the surface of the BM?" If Yes, ask: "When was the last time?"     Blood on tissue - fair amount - from bowel 6. CHRONIC CONSTIPATION: "Is this a new problem for you?"  If No, ask: "How long have you had this problem?" (days, weeks, months)      Just 3 days 7. CHANGES IN DIET OR HYDRATION: "Have there been any recent changes in your diet?" "How much fluids are you drinking on a daily basis?"  "How much have you had to drink  today?"      8. MEDICINES: "Have you been taking any new medicines?" "Are you taking any narcotic pain medicines?" (e.g., Dilaudid, morphine, Percocet, Vicodin)     NO 9. LAXATIVES: "Have you been using any stool softeners, laxatives, or enemas?"  If Yes, ask "What, how often, and when was the last time?"     no 10. ACTIVITY:  "How much walking do you do every day?"  "Has your activity level decreased in the past week?"        walking 11. CAUSE: "What do you think is causing the constipation?"        Unsure 12. OTHER SYMPTOMS: "Do you have any other symptoms?" (e.g., abdomen pain, bloating, fever, vomiting)       no 13. MEDICAL HISTORY: "Do you have a history of hemorrhoids, rectal fissures, or rectal surgery or rectal abscess?"         no  Answer Assessment - Initial Assessment Questions 1. APPEARANCE of BLOOD: "What color is it?" "Is it passed separately, on the surface of the stool, or mixed in with the stool?"      Not passing stool. Blood on tissue 2. AMOUNT: "How much blood was passed?"      Fair amount 3. FREQUENCY: "How many times has blood been passed with the stools?"  4. ONSET: "When was the blood first seen in the stools?" (Days or weeks)       5. DIARRHEA: "Is there also some diarrhea?" If Yes, ask: "How many diarrhea stools in the past 24 hours?"       6. CONSTIPATION: "Do you have constipation?" If Yes, ask: "How bad is it?"     yes 7. RECURRENT SYMPTOMS: "Have you had blood in your stools before?" If Yes, ask: "When was the last time?" and "What happened that time?"      no 8. BLOOD THINNERS: "Do you take any blood thinners?" (e.g., Coumadin/warfarin, Pradaxa/dabigatran, aspirin)     yes 9. OTHER SYMPTOMS: "Do you have any other symptoms?"  (e.g., abdomen pain, vomiting, dizziness, fever)      10. PREGNANCY: "Is there any chance you are pregnant?" "When was your last menstrual period?"  Protocols used: Constipation-A-AH, Rectal Bleeding-A-AH

## 2022-09-13 DIAGNOSIS — K219 Gastro-esophageal reflux disease without esophagitis: Secondary | ICD-10-CM | POA: Diagnosis not present

## 2022-09-13 DIAGNOSIS — R7989 Other specified abnormal findings of blood chemistry: Secondary | ICD-10-CM | POA: Diagnosis not present

## 2022-09-13 DIAGNOSIS — M109 Gout, unspecified: Secondary | ICD-10-CM | POA: Diagnosis not present

## 2022-09-13 DIAGNOSIS — Z1212 Encounter for screening for malignant neoplasm of rectum: Secondary | ICD-10-CM | POA: Diagnosis not present

## 2022-09-13 DIAGNOSIS — E559 Vitamin D deficiency, unspecified: Secondary | ICD-10-CM | POA: Diagnosis not present

## 2022-09-13 DIAGNOSIS — I10 Essential (primary) hypertension: Secondary | ICD-10-CM | POA: Diagnosis not present

## 2022-09-13 DIAGNOSIS — Z125 Encounter for screening for malignant neoplasm of prostate: Secondary | ICD-10-CM | POA: Diagnosis not present

## 2022-09-13 DIAGNOSIS — E785 Hyperlipidemia, unspecified: Secondary | ICD-10-CM | POA: Diagnosis not present

## 2022-09-13 DIAGNOSIS — R7301 Impaired fasting glucose: Secondary | ICD-10-CM | POA: Diagnosis not present

## 2022-09-13 DIAGNOSIS — R002 Palpitations: Secondary | ICD-10-CM | POA: Diagnosis not present

## 2022-09-20 DIAGNOSIS — Z1339 Encounter for screening examination for other mental health and behavioral disorders: Secondary | ICD-10-CM | POA: Diagnosis not present

## 2022-09-20 DIAGNOSIS — E669 Obesity, unspecified: Secondary | ICD-10-CM | POA: Diagnosis not present

## 2022-09-20 DIAGNOSIS — M47816 Spondylosis without myelopathy or radiculopathy, lumbar region: Secondary | ICD-10-CM | POA: Diagnosis not present

## 2022-09-20 DIAGNOSIS — Z0000 Encounter for general adult medical examination without abnormal findings: Secondary | ICD-10-CM | POA: Diagnosis not present

## 2022-09-20 DIAGNOSIS — Z6832 Body mass index (BMI) 32.0-32.9, adult: Secondary | ICD-10-CM | POA: Diagnosis not present

## 2022-09-20 DIAGNOSIS — E785 Hyperlipidemia, unspecified: Secondary | ICD-10-CM | POA: Diagnosis not present

## 2022-09-20 DIAGNOSIS — Z1331 Encounter for screening for depression: Secondary | ICD-10-CM | POA: Diagnosis not present

## 2022-09-20 DIAGNOSIS — K219 Gastro-esophageal reflux disease without esophagitis: Secondary | ICD-10-CM | POA: Diagnosis not present

## 2022-09-20 DIAGNOSIS — I10 Essential (primary) hypertension: Secondary | ICD-10-CM | POA: Diagnosis not present

## 2022-09-20 DIAGNOSIS — G4733 Obstructive sleep apnea (adult) (pediatric): Secondary | ICD-10-CM | POA: Diagnosis not present

## 2022-09-20 DIAGNOSIS — I480 Paroxysmal atrial fibrillation: Secondary | ICD-10-CM | POA: Diagnosis not present

## 2022-09-20 DIAGNOSIS — M109 Gout, unspecified: Secondary | ICD-10-CM | POA: Diagnosis not present

## 2022-09-21 DIAGNOSIS — G4733 Obstructive sleep apnea (adult) (pediatric): Secondary | ICD-10-CM | POA: Diagnosis not present

## 2022-10-10 ENCOUNTER — Other Ambulatory Visit: Payer: Self-pay | Admitting: Internal Medicine

## 2022-10-10 DIAGNOSIS — M47819 Spondylosis without myelopathy or radiculopathy, site unspecified: Secondary | ICD-10-CM

## 2022-10-24 ENCOUNTER — Ambulatory Visit
Admission: RE | Admit: 2022-10-24 | Discharge: 2022-10-24 | Disposition: A | Discharge status: Home / Self Care | Payer: Medicare HMO | Source: Ambulatory Visit | Attending: Internal Medicine | Admitting: Internal Medicine

## 2022-10-24 DIAGNOSIS — M47819 Spondylosis without myelopathy or radiculopathy, site unspecified: Secondary | ICD-10-CM

## 2022-10-24 DIAGNOSIS — M5136 Other intervertebral disc degeneration, lumbar region: Secondary | ICD-10-CM | POA: Diagnosis not present

## 2022-10-24 DIAGNOSIS — M48061 Spinal stenosis, lumbar region without neurogenic claudication: Secondary | ICD-10-CM | POA: Diagnosis not present

## 2022-11-02 ENCOUNTER — Other Ambulatory Visit: Payer: Self-pay | Admitting: Cardiology

## 2022-11-07 NOTE — Progress Notes (Signed)
Cardiology Office Note:    Date:  11/14/2022   ID:  Jim Nguyen, DOB 07-19-1950, MRN [id number]  PCP:  Rodrigo Ran, MD  Cardiologist:  Peter Swaziland, MD  Electrophysiologist:  None   Referring MD: Rodrigo Ran, MD   Chief Complaint: follow-up of atrial fibrillation  History of Present Illness:    Jim Nguyen is a 72 y.o. male with a history of paroxysmal atrial fibrillation on Eliquis, hypertension, hyperlipidemia, GERD, obstructive sleep apnea, and gout who is followed by Dr. Swaziland and presents today for routine follow-up of atrial fibrillation.  Patient has a history of palpitations. Prior outpatient monitor in 2016 showed PACs but no significant arrhythmias. More recent monitor in 04/2019 showed repetitive runs of PAT with longest episode lasting 1 minute and 13 seconds; however, no evidence of atrial fibrillation. Triggered events did correlate with PAT. Echo in 04/2019 showed LVEF of 65-70% with grade 1 diastolic dysfunction, normal RV, and no significant valvular disease. Patient had persistent symptoms following most recent outpatient monitor and sent in some Apple Watch tracing which did show evidence of atrial fibrillation. Atrial fibrillation was then later confirmed with an in-office EKG. Therefore, his Diltiazem was increased and he was started on Eliquis. He subsequently completed a home sleep study which was positive for sleep apnea and has been started on CPAP. This is followed by Dr. Mayford Knife. He was started on Flecainide in 11/2021 and has not had any recurrent atrial fibrillation since that time.   He was last seen by Dr. Swaziland in 05/2022 at which time he was doing well from a cardiac standpoint.  Patient presents today for follow-up. Here alone. He is doing well since last visit. He denies any recurrent atrial fibrillation since starting maintenance dose of Flecainide. He denies any chest pain, shortness of breath, orthopnea, PND, edema, palpitations, significant  dizziness, or syncope. He is compliant with all his medications and is tolerating them well.   Past Medical History:  Diagnosis Date   Anticoagulant long-term use    Eliquis-- managed by cardiologist   Arthritis    Chronic gout    per pt last episode approx. 2018   GERD (gastroesophageal reflux disease)    History of adenomatous polyp of colon    Hyperlipidemia    Hypertension    followed by cardiology and pcp   (ETT 10-09-2008 in epic, normal without ischemia)   Inguinal hernia of right side without obstruction or gangrene    OSA on CPAP    followed by dr t. turner   PAF (paroxysmal atrial fibrillation) ([facility]) 05/2019   cardiologist-- dr Swaziland--  long hx palpitations   PONV (postoperative nausea and vomiting)     Past Surgical History:  Procedure Laterality Date   COLONOSCOPY     last one 07/ 2022   INGUINAL HERNIA REPAIR Right 02/12/2021   Procedure: RIGHT INGUINAL HERNIA REPAIR WITH MESH;  Surgeon: Darnell Level, MD;  Location: [facility];  Service: General;  Laterality: Right;   LAPAROSCOPIC CHOLECYSTECTOMY  1995    Current Medications: Current Meds  Medication Sig   allopurinol (ZYLOPRIM) 300 MG tablet Take 300 mg by mouth daily.   apixaban (ELIQUIS) 5 MG TABS tablet Take 1 tablet (5 mg total) by mouth 2 (two) times daily.   atorvastatin (LIPITOR) 20 MG tablet Take 20 mg by mouth at bedtime.   cholecalciferol (VITAMIN D3) 25 MCG (1000 UNIT) tablet Take 1,000 Units by mouth daily.   diltiazem (CARDIZEM CD) 360 MG  24 hr capsule TAKE 1 CAPSULE BY MOUTH EVERY DAY   flecainide (TAMBOCOR) 100 MG tablet TAKE 1 TABLET 2 TIMES DAILY. TAKE 3 TABLETS AS NEEDED FOR AN EPISODE OF AFIB. 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  Tobacco Use   Smoking status: Former    Packs/day: 0.50    Years: 30.00    Additional pack years: 0.00    Total pack years: 15.00    Types: Cigars, Cigarettes    Quit date: 01/22/2016    Years since quitting: 6.8   Smokeless tobacco: Never  Vaping Use   Vaping Use: Never used  Substance and Sexual Activity   Alcohol use: Yes    Alcohol/week: 10.0 - 12.0 standard drinks of alcohol    Types: 10 - 12 Glasses of wine per week    Comment: 1-2 glass per day   Drug use: No   Sexual activity: Not on file  Other Topics Concern   Not on file  Social History Narrative   Not on file   Social Determinants of Health   Financial Resource Strain: Not on file  Food Insecurity: Not on file  Transportation Needs: Not on file  Physical Activity: Not on file  Stress: Not on file  Social Connections: Not on file     Family History: The patient's family history includes Heart disease in his father; Pancreatic cancer in his father. There is no history of Colon cancer.  ROS:   Please see the history of present illness.     EKGs/Labs/Other Studies Reviewed:    The following studies were reviewed:  Echocardiogram 04/15/2019: Impressions: 1. Left ventricular ejection fraction, by visual estimation, is 65 to  70%. The left ventricle has hyperdynamic function. There is no left  ventricular hypertrophy.   2. Left ventricular diastolic parameters are consistent with Grade I  diastolic dysfunction (impaired relaxation).   3. Global right ventricle has normal systolic function.The right  ventricular size is normal. No increase in right ventricular wall  thickness.   4. Left atrial size was mildly dilated.   5. Right atrial size was normal.   6. Mild mitral annular calcification.   7. The mitral valve is abnormal. Trace mitral valve regurgitation.   8. The tricuspid valve is normal in structure. Tricuspid valve  regurgitation is mild.   9. The aortic valve is  tricuspid. Aortic valve regurgitation is not  visualized. Mild aortic valve sclerosis without stenosis.  10. The pulmonic valve was normal in structure. Pulmonic valve  regurgitation is trivial.  _______________  [facility] Monitor 04/19/2019 to 04/27/2019: NSR Repetitive runs of PAT longest lasting 1 minute and 13 seconds Patient triggered events correlate with PAT   EKG:  EKG ordered today. EKG personally reviewed and demonstrates normal sinus rhythm, rate 60 bpm, with non-specific T wave changes. Normal PR and QRS intervals. QTc 436 ms.  Recent Labs: No results found for requested labs within last 365 days.  Recent Lipid Panel    Component Value Date/Time   CHOL 125 03/29/2011 0836   TRIG 141.0 03/29/2011 0836   HDL 33.50 (L) 03/29/2011 0836   CHOLHDL 4 03/29/2011 1610  VLDL 28.2 03/29/2011 0836   LDLCALC 63 03/29/2011 0836    Physical Exam:    Vital Signs: BP 126/60 (BP Location: Left Arm, Patient Position: Sitting, Cuff Size: Normal)   Pulse 60   Ht 5\' 10"  (1.778 m)   Wt 232 lb 6.4 oz (105.4 kg)   SpO2 95%   BMI 33.35 kg/m     Wt Readings from Last 3 Encounters:  11/14/22 232 lb 6.4 oz (105.4 kg)  06/21/22 230 lb 9.6 oz (104.6 kg)  05/06/22 223 lb (101.2 kg)     General: 72 y.o. male in no acute distress. HEENT: Normocephalic and atraumatic. Sclera clear.  Neck: Supple. No carotid bruits. No JVD. Heart: RRR. Distinct S1 and S2. No murmurs, gallops, or rubs.  Lungs: No increased work of breathing. Clear to ausculation bilaterally. No wheezes, rhonchi, or rales.  Abdomen: Soft, non-distended, and non-tender to palpation.  Extremities: No lower extremity edema.    Skin: Warm and dry. Neuro: Alert and oriented x3. No focal deficits. Psych: Normal affect. Responds appropriately.   Assessment:    1. Paroxysmal atrial fibrillation (HCC)   2. Essential hypertension   3. Hyperlipidemia, unspecified hyperlipidemia type   4. Obstructive sleep apnea   5. Medication  management     Plan:    Paroxysmal Atrial Fibrillation He was started on Flecainide for his atrial fibrillation in 11/2021 with no recurrence.  - Maintaining sinus rhythm today.  - Continue Flecainide 100mg  twice daily.  - Continue Diltiazem 360mg  daily.  - Continue chronic anticoagulation with Eliquis 5mg  twice daily.  - Will order ETT since he has not had one done since starting Flecainide.   Shared Decision Making/Informed Consent{ The risks [chest pain, shortness of breath, cardiac arrhythmias, dizziness, blood pressure fluctuations, myocardial infarction, stroke/transient ischemic attack, and life-threatening complications (estimated to be 1 in 10,000)], benefits (risk stratification, diagnosing coronary artery disease, treatment guidance) and alternatives of an exercise tolerance test were discussed in detail with Mr. Sedano and he agrees to proceed.  Hypertension BP well controlled.  - Continue Diltiazem 360mg  daily.   Hyperlipidemia Most recent lipid panel in 09/2022 at PCP's office: Total Cholesterol 118, Triglycerides 116, HDL 42, LDL 53.  - Continue Lipitor 20mg  daily.  - Labs followed by PCP.  Obstructive Sleep Apnea - Continue CPAP. - Followed by Dr. Mayford Knife.   Disposition: Follow up in 6 months.   Medication Adjustments/Labs and Tests Ordered: Current medicines are reviewed at length with the patient today.  Concerns regarding medicines are outlined above.  Orders Placed This Encounter  Procedures   Cardiac Stress Test: Informed Consent Details: Physician/Practitioner Attestation; Transcribe to consent form and obtain patient signature   EXERCISE TOLERANCE TEST (ETT)   EKG 12-Lead   No orders of the defined types were placed in this encounter.   Patient Instructions  Medication Instructions:   HOLD Diltiazem the morning of stress test  *If you need a refill on your cardiac medications before your next appointment, please call your pharmacy*  Lab  Work: NONE ordered at this time of appointment   If you have labs (blood work) drawn today and your tests are completely normal, you will receive your results only by: MyChart Message (if you have MyChart) OR A paper copy in the mail If you have any lab test that is abnormal or we need to change your treatment, we will call you to review the results.  Testing/Procedures: Your physician has requested that you have an exercise tolerance test.  Follow-Up: At [facility], you and your health needs are our priority.  As part of our continuing mission to provide you with exceptional heart care, we have created designated Provider Care Teams.  These Care Teams include your primary Cardiologist (physician) and Advanced Practice Providers (APPs -  Physician Assistants and Nurse Practitioners) who all work together to provide you with the care you need, when you need it.    Your next appointment:   6 month(s)  Provider:   Peter Swaziland, MD  or Marjie Skiff, PA-C        Other Instructions     Signed, Corrin Parker, PA-C  11/14/2022 12:11 PM    [facility]

## 2022-11-14 ENCOUNTER — Encounter: Payer: Self-pay | Admitting: Student

## 2022-11-14 ENCOUNTER — Ambulatory Visit: Payer: Medicare HMO | Attending: Student | Admitting: Student

## 2022-11-14 VITALS — BP 126/60 | HR 60 | Ht 70.0 in | Wt 232.4 lb

## 2022-11-14 DIAGNOSIS — E785 Hyperlipidemia, unspecified: Secondary | ICD-10-CM | POA: Diagnosis not present

## 2022-11-14 DIAGNOSIS — Z79899 Other long term (current) drug therapy: Secondary | ICD-10-CM

## 2022-11-14 DIAGNOSIS — I10 Essential (primary) hypertension: Secondary | ICD-10-CM

## 2022-11-14 DIAGNOSIS — I480 Paroxysmal atrial fibrillation: Secondary | ICD-10-CM

## 2022-11-14 DIAGNOSIS — G4733 Obstructive sleep apnea (adult) (pediatric): Secondary | ICD-10-CM

## 2022-11-14 NOTE — Patient Instructions (Addendum)
Medication Instructions:   HOLD Diltiazem the morning of stress test  *If you need a refill on your cardiac medications before your next appointment, please call your pharmacy*  Lab Work: NONE ordered at this time of appointment   If you have labs (blood work) drawn today and your tests are completely normal, you will receive your results only by: MyChart Message (if you have MyChart) OR A paper copy in the mail If you have any lab test that is abnormal or we need to change your treatment, we will call you to review the results.  Testing/Procedures: Your physician has requested that you have an exercise tolerance test.   Follow-Up: At [facility], you and your health needs are our priority.  As part of our continuing mission to provide you with exceptional heart care, we have created designated Provider Care Teams.  These Care Teams include your primary Cardiologist (physician) and Advanced Practice Providers (APPs -  Physician Assistants and Nurse Practitioners) who all work together to provide you with the care you need, when you need it.    Your next appointment:   6 month(s)  Provider:   Peter Swaziland, MD  or Marjie Skiff, PA-C        Other Instructions

## 2022-11-23 DIAGNOSIS — Z6833 Body mass index (BMI) 33.0-33.9, adult: Secondary | ICD-10-CM | POA: Diagnosis not present

## 2022-11-23 DIAGNOSIS — M47816 Spondylosis without myelopathy or radiculopathy, lumbar region: Secondary | ICD-10-CM | POA: Diagnosis not present

## 2022-11-30 ENCOUNTER — Telehealth: Payer: Self-pay | Admitting: Cardiology

## 2022-11-30 DIAGNOSIS — I480 Paroxysmal atrial fibrillation: Secondary | ICD-10-CM

## 2022-11-30 MED ORDER — APIXABAN 5 MG PO TABS: 5.0000 mg | ORAL_TABLET | Freq: Two times a day (BID) | ORAL | 1 refills | Status: DC

## 2022-11-30 NOTE — Telephone Encounter (Signed)
*  STAT* If patient is at the pharmacy, call can be transferred to refill team.   1. Which medications need to be refilled? (please list name of each medication and dose if known)  apixaban (ELIQUIS) 5 MG TABS tablet   2. Which pharmacy/location (including street and city if local pharmacy) is medication to be sent to?  [id number] - [place], [place] - [address] AT [address] OF [address]   3. Do they need a 30 day or 90 day supply? 90 day   Patient only has a few pills left and is going out of town on Saturday.

## 2022-11-30 NOTE — Telephone Encounter (Signed)
Prescription refill request for Eliquis received. Indication: a fib Last office visit: 11/14/22 Scr: 1.18 on 09/13/22 KPN Age: 72 Weight: 105kg

## 2022-12-14 ENCOUNTER — Encounter (HOSPITAL_COMMUNITY): Payer: Self-pay | Admitting: *Deleted

## 2022-12-14 ENCOUNTER — Telehealth (HOSPITAL_COMMUNITY): Payer: Self-pay | Admitting: *Deleted

## 2022-12-14 NOTE — Telephone Encounter (Signed)
Letter sent to my chart with instructions for upcoming Stress test.  Jim Nguyen

## 2022-12-21 ENCOUNTER — Ambulatory Visit (HOSPITAL_COMMUNITY): Payer: Medicare HMO | Attending: Cardiovascular Disease

## 2022-12-21 DIAGNOSIS — I480 Paroxysmal atrial fibrillation: Secondary | ICD-10-CM | POA: Diagnosis not present

## 2022-12-21 DIAGNOSIS — Z79899 Other long term (current) drug therapy: Secondary | ICD-10-CM | POA: Diagnosis not present

## 2022-12-22 LAB — EXERCISE TOLERANCE TEST
Angina Index: 0
Duke Treadmill Score: 6
Estimated workload: 7
Exercise duration (min): 6 min
Exercise duration (sec): 0 s
MPHR: 150 {beats}/min
Peak HR: 98 {beats}/min
Percent HR: 65 %
RPE: 17
Rest HR: 71 {beats}/min
ST Depression (mm): 0 mm

## 2022-12-26 DIAGNOSIS — G4733 Obstructive sleep apnea (adult) (pediatric): Secondary | ICD-10-CM | POA: Diagnosis not present

## 2023-03-02 DIAGNOSIS — H109 Unspecified conjunctivitis: Secondary | ICD-10-CM | POA: Diagnosis not present

## 2023-03-02 DIAGNOSIS — J069 Acute upper respiratory infection, unspecified: Secondary | ICD-10-CM | POA: Diagnosis not present

## 2023-03-02 DIAGNOSIS — M791 Myalgia, unspecified site: Secondary | ICD-10-CM | POA: Diagnosis not present

## 2023-03-02 DIAGNOSIS — R0981 Nasal congestion: Secondary | ICD-10-CM | POA: Diagnosis not present

## 2023-03-02 DIAGNOSIS — J029 Acute pharyngitis, unspecified: Secondary | ICD-10-CM | POA: Diagnosis not present

## 2023-03-02 DIAGNOSIS — R059 Cough, unspecified: Secondary | ICD-10-CM | POA: Diagnosis not present

## 2023-03-02 DIAGNOSIS — Z1152 Encounter for screening for COVID-19: Secondary | ICD-10-CM | POA: Diagnosis not present

## 2023-03-08 ENCOUNTER — Telehealth: Payer: Self-pay | Admitting: Cardiology

## 2023-03-08 DIAGNOSIS — I480 Paroxysmal atrial fibrillation: Secondary | ICD-10-CM

## 2023-03-08 MED ORDER — APIXABAN 5 MG PO TABS: 5.0000 mg | ORAL_TABLET | Freq: Two times a day (BID) | ORAL | 1 refills | Status: DC

## 2023-03-08 NOTE — Telephone Encounter (Signed)
*  STAT* If patient is at the pharmacy, call can be transferred to refill team.   1. Which medications need to be refilled? (please list name of each medication and dose if known) apixaban (ELIQUIS) 5 MG TABS tablet     4. Which pharmacy/location (including street and city if local pharmacy) is medication to be sent to? [id number] - [place], [place] - [address]     5. Do they need a 30 day or 90 day supply? 90

## 2023-03-08 NOTE — Telephone Encounter (Signed)
Prescription refill request for Eliquis received. Indication: PAF Last office visit: 11/14/22  Thane Edu PA-C Scr: 1.0 on 10/29/21  KPN Age: 72 Weight: 105.4kg  Based on above findings Eliquis 5mg  twice daily is the appropriate dose.  Refill approved.  Labs requested at upcoming MD appt.

## 2023-03-20 DIAGNOSIS — T1502XS Foreign body in cornea, left eye, sequela: Secondary | ICD-10-CM | POA: Diagnosis not present

## 2023-03-20 DIAGNOSIS — H5213 Myopia, bilateral: Secondary | ICD-10-CM | POA: Diagnosis not present

## 2023-03-20 DIAGNOSIS — H2513 Age-related nuclear cataract, bilateral: Secondary | ICD-10-CM | POA: Diagnosis not present

## 2023-04-25 NOTE — Progress Notes (Signed)
Cardiology Office Note:    Date:  05/02/2023   ID:  Jim Nguyen, DOB 04-04-1951, MRN [id number]  PCP:  Jim Ran, MD  Cardiologist:  Jim Swaziland, MD Cardiology APP:  Jim Parker, PA-C  Sleep Medicine:  Jim Magic, MD     Referring MD: Jim Ran, MD   Chief Complaint: follow-up of atrial fibrillation  History of Present Illness:    Jim Nguyen is a 72 y.o. male with a history of paroxysmal atrial fibrillation on Eliquis, hypertension, hyperlipidemia, GERD, obstructive sleep apnea, and gout who is followed by Dr. Swaziland and presents today for routine follow-up.  Patient has a history of palpitations. Prior outpatient monitor in 2016 showed PACs but no significant arrhythmias. More recent monitor in 04/2019 showed repetitive runs of PAT with longest episode lasting 1 minute and 13 seconds; however, no evidence of atrial fibrillation. Triggered events did correlate with PAT. Echo in 04/2019 showed LVEF of 65-70% with grade 1 diastolic dysfunction, normal RV, and no significant valvular disease. Patient had persistent symptoms following most recent outpatient monitor and sent in some Apple Watch tracing which did show evidence of atrial fibrillation. Atrial fibrillation was then later confirmed with an in-office EKG. Therefore, his Diltiazem was increased and he was started on Eliquis. He subsequently completed a home sleep study which was positive for sleep apnea and has been started on CPAP. This is followed by Dr. Mayford Nguyen. He was started on Flecainide in 11/2021 and has not had any recurrent atrial fibrillation since that time.   He was last see by me in 11/2022 at which time he was doing well with no recurrent atrial fibrillation. ETT was ordered to assess for any exercise induced arrhythmias while on Flecainide. He reported fatigue during the ETT so test was stopped early. He was only able to reach  65% of MPHR but there was no exercise-induced arrhythmias.   Patient  presents today for follow-up. He is doing well. He is getting ready to go to the [place] after Thanksgiving to participate in a charity golf tournament with professional golfers and he is looking forward to this.  He denies any recurrent atrial fibrillation since being started on maintenance Flecainide. No palpitations. No chest pain, shortness of breath, orthopnea, PND, edema. He reports occasional lightheadedness if he runs up the stairs to quickly but this does not last long. No palpitations, falls, or syncope. No abnormal bleeding on the Eliquis. He does state that it is hard to get his heart rate up when he is exercising. He does not described any decreased exercise tolerance but just wanted to know if this was okay. We discussed that is likely due to the medications he is on. Overall, tolerating medications well.  EKGs/Labs/Other Studies Reviewed:    The following studies were reviewed:  Echocardiogram 04/15/2019: Impressions: 1. Left ventricular ejection fraction, by visual estimation, is 65 to  70%. The left ventricle has hyperdynamic function. There is no left  ventricular hypertrophy.   2. Left ventricular diastolic parameters are consistent with Grade I  diastolic dysfunction (impaired relaxation).   3. Global right ventricle has normal systolic function.The right  ventricular size is normal. No increase in right ventricular wall  thickness.   4. Left atrial size was mildly dilated.   5. Right atrial size was normal.   6. Mild mitral annular calcification.   7. The mitral valve is abnormal. Trace mitral valve regurgitation.   8. The tricuspid valve is normal in structure. Tricuspid valve  regurgitation is mild.   9. The aortic valve is tricuspid. Aortic valve regurgitation is not  visualized. Mild aortic valve sclerosis without stenosis.  10. The pulmonic valve was normal in structure. Pulmonic valve  regurgitation is trivial.  _______________   [employer] Monitor 04/19/2019 to  04/27/2019: NSR Repetitive runs of PAT longest lasting 1 minute and 13 seconds Patient triggered events correlate with PAT _______________  Exercise Tolerance Test 12/21/2022:   No ST deviation was noted.   ETT with fair exercise tolerance (6:00); no chest pain; normal blood pressure response; no diagnostic ST changes; no exercise-induced arrhythmias (patient on flecainide); negative inadequate exercise treadmill (patient did not achieve target heart rate-65 % predicted THR).   EKG:  EKG not ordered today.   Recent Labs: No results found for requested labs within last 365 days.  Recent Lipid Panel    Component Value Date/Time   CHOL 125 03/29/2011 0836   TRIG 141.0 03/29/2011 0836   HDL 33.50 (L) 03/29/2011 0836   CHOLHDL 4 03/29/2011 0836   VLDL 28.2 03/29/2011 0836   LDLCALC 63 03/29/2011 0836    Physical Exam:    Vital Signs: BP 118/76 (BP Location: Left Arm, Patient Position: Sitting, Cuff Size: Normal)   Pulse 65   Ht 5\' 10"  (1.778 m)   Wt 228 lb (103.4 kg)   SpO2 96%   BMI 32.71 kg/m     Wt Readings from Last 3 Encounters:  05/02/23 228 lb (103.4 kg)  11/14/22 232 lb 6.4 oz (105.4 kg)  06/21/22 230 lb 9.6 oz (104.6 kg)     General: 72 y.o. Caucasian male in no acute distress. HEENT: Normocephalic and atraumatic. Sclera clear.  Neck: Supple. No carotid bruits. No JVD. Heart: RRR. Distinct S1 and S2. No murmurs, gallops, or rubs.  Lungs: No increased work of breathing. Clear to ausculation bilaterally. No wheezes, rhonchi, or rales.  Abdomen: Soft, non-distended, and non-tender to palpation.  Extremities: No lower extremity edema.  Radial and distal pedal pulses 2+ and equal bilaterally. Skin: Warm and dry. Neuro: No focal deficits. Psych: Normal affect. Responds appropriately.   Assessment:    1. Paroxysmal atrial fibrillation (HCC)   2. Primary hypertension   3. Hyperlipidemia, unspecified hyperlipidemia type   4. Obstructive sleep apnea     Plan:     Paroxysmal Atrial Fibrillation He was started on Flecainide for his atrial fibrillation in 11/2021 with no recurrence. ETT in 12/2022 showed no exercise-induced arrhythmias.  - Maintaining sinus rhythm on exam.  - Continue Flecainide 100mg  twice daily. On his medication list, he also has listed that he can  take three of the 100mg  tablets (for total of 300mg ) as needed for breakthrough atrial fibrillation. Per review of Epocrates, max dose of Flecainide is 300mg  daily. Discussed with Pharmacy who recommended patient only take an additional 100mg  for breakthrough atrial fibrillation. Advised patient of this recommendation and that he should not exceed 300mg  of Flecainide daily.  - Continue Diltiazem 360mg  daily.  - Continue chronic anticoagulation with Eliquis 5mg  twice daily.  - Will check routine CBC, BMET, and Magnesium.   Hypertension BP well controlled.  - Continue Diltiazem 360mg  daily.    Hyperlipidemia Most recent lipid panel in 09/2022 at PCP's office: Total Cholesterol 118, Triglycerides 116, HDL 42, LDL 53.  - Continue Lipitor 20mg  daily.  - Labs followed by PCP.   Obstructive Sleep Apnea - Continue CPAP. - Followed by Dr. Mayford Nguyen.   Disposition: Follow up in 6 months.   Signed,  Jim Parker, PA-C  05/02/2023 11:02 AM    [facility]

## 2023-05-01 DIAGNOSIS — M545 Low back pain, unspecified: Secondary | ICD-10-CM | POA: Diagnosis not present

## 2023-05-01 DIAGNOSIS — R269 Unspecified abnormalities of gait and mobility: Secondary | ICD-10-CM | POA: Diagnosis not present

## 2023-05-01 DIAGNOSIS — M25551 Pain in right hip: Secondary | ICD-10-CM | POA: Diagnosis not present

## 2023-05-02 ENCOUNTER — Encounter: Payer: Self-pay | Admitting: Student

## 2023-05-02 ENCOUNTER — Ambulatory Visit: Payer: Medicare HMO | Attending: Student | Admitting: Student

## 2023-05-02 VITALS — BP 118/76 | HR 65 | Ht 70.0 in | Wt 228.0 lb

## 2023-05-02 DIAGNOSIS — I10 Essential (primary) hypertension: Secondary | ICD-10-CM

## 2023-05-02 DIAGNOSIS — G4733 Obstructive sleep apnea (adult) (pediatric): Secondary | ICD-10-CM

## 2023-05-02 DIAGNOSIS — E785 Hyperlipidemia, unspecified: Secondary | ICD-10-CM

## 2023-05-02 DIAGNOSIS — I480 Paroxysmal atrial fibrillation: Secondary | ICD-10-CM | POA: Diagnosis not present

## 2023-05-02 MED ORDER — FLECAINIDE ACETATE 100 MG PO TABS: 100.0000 mg | ORAL_TABLET | Freq: Two times a day (BID) | ORAL | Status: DC

## 2023-05-02 NOTE — Patient Instructions (Signed)
Medication Instructions:  NO CHANGES *If you need a refill on your cardiac medications before your next appointment, please call your pharmacy*   Lab Work: CBC,BMP, AND MAGNESIUM TODAY If you have labs (blood work) drawn today and your tests are completely normal, you will receive your results only by: MyChart Message (if you have MyChart) OR A paper copy in the mail If you have any lab test that is abnormal or we need to change your treatment, we will call you to review the results.   Testing/Procedures: NO TESTING   Follow-Up: At [facility], you and your health needs are our priority.  As part of our continuing mission to provide you with exceptional heart care, we have created designated Provider Care Teams.  These Care Teams include your primary Cardiologist (physician) and Advanced Practice Providers (APPs -  Physician Assistants and Nurse Practitioners) who all work together to provide you with the care you need, when you need it.  Your next appointment:   6 month(s)  Provider:   Peter Swaziland, MD

## 2023-05-03 LAB — BASIC METABOLIC PANEL
BUN/Creatinine Ratio: 18 (ref 10–24)
BUN: 15 mg/dL (ref 8–27)
CO2: 27 mmol/L (ref 20–29)
Calcium: 9.5 mg/dL (ref 8.6–10.2)
Chloride: 103 mmol/L (ref 96–106)
Creatinine, Ser: 0.83 mg/dL (ref 0.76–1.27)
Glucose: 110 mg/dL — ABNORMAL HIGH (ref 70–99)
Potassium: 4.6 mmol/L (ref 3.5–5.2)
Sodium: 141 mmol/L (ref 134–144)
eGFR: 94 mL/min/{1.73_m2} (ref >59)

## 2023-05-03 LAB — CBC
Hematocrit: 49.2 % (ref 37.5–51.0)
Hemoglobin: 15.9 g/dL (ref 13.0–17.7)
MCH: 31.2 pg (ref 26.6–33.0)
MCHC: 32.3 g/dL (ref 31.5–35.7)
MCV: 97 fL (ref 79–97)
Platelets: 162 10*3/uL (ref 150–450)
RBC: 5.1 x10E6/uL (ref 4.14–5.80)
RDW: 12.9 % (ref 11.6–15.4)
WBC: 5.9 10*3/uL (ref 3.4–10.8)

## 2023-05-03 LAB — MAGNESIUM: Magnesium: 2.2 mg/dL (ref 1.6–2.3)

## 2023-05-15 DIAGNOSIS — M25551 Pain in right hip: Secondary | ICD-10-CM | POA: Diagnosis not present

## 2023-05-15 DIAGNOSIS — R269 Unspecified abnormalities of gait and mobility: Secondary | ICD-10-CM | POA: Diagnosis not present

## 2023-05-15 DIAGNOSIS — M545 Low back pain, unspecified: Secondary | ICD-10-CM | POA: Diagnosis not present

## 2023-05-29 DIAGNOSIS — R269 Unspecified abnormalities of gait and mobility: Secondary | ICD-10-CM | POA: Diagnosis not present

## 2023-05-29 DIAGNOSIS — M545 Low back pain, unspecified: Secondary | ICD-10-CM | POA: Diagnosis not present

## 2023-05-29 DIAGNOSIS — M25551 Pain in right hip: Secondary | ICD-10-CM | POA: Diagnosis not present

## 2023-07-04 ENCOUNTER — Telehealth: Payer: Self-pay | Admitting: Cardiology

## 2023-07-04 DIAGNOSIS — I480 Paroxysmal atrial fibrillation: Secondary | ICD-10-CM

## 2023-07-04 MED ORDER — APIXABAN 5 MG PO TABS: 5.0000 mg | ORAL_TABLET | Freq: Two times a day (BID) | ORAL | 1 refills | Status: DC

## 2023-07-04 NOTE — Telephone Encounter (Signed)
Prescription refill request for Eliquis received. Indication: PAF Last office visit: 05/02/23  Thane Edu PA-C Scr: 0.83 on 05/02/23  Epic Age: 73 Weight: 103.4kg  Based on above findings Eliquis 5mg  twice daily is the appropriate dose.  Refill approved.

## 2023-07-04 NOTE — Telephone Encounter (Signed)
*  STAT* If patient is at the pharmacy, call can be transferred to refill team.   1. Which medications need to be refilled? (please list name of each medication and dose if known)   apixaban (ELIQUIS) 5 MG TABS tablet      4. Which pharmacy/location (including street and city if local pharmacy) is medication to be sent to? [id number] - [place], [place] - [address] AT [address] OF [address]     5. Do they need a 30 day or 90 day supply? 90    Pt states he is completely out

## 2023-07-06 ENCOUNTER — Other Ambulatory Visit: Payer: Self-pay | Admitting: Cardiology

## 2023-07-17 DIAGNOSIS — L812 Freckles: Secondary | ICD-10-CM | POA: Diagnosis not present

## 2023-07-17 DIAGNOSIS — L820 Inflamed seborrheic keratosis: Secondary | ICD-10-CM | POA: Diagnosis not present

## 2023-07-17 DIAGNOSIS — L821 Other seborrheic keratosis: Secondary | ICD-10-CM | POA: Diagnosis not present

## 2023-07-17 DIAGNOSIS — D485 Neoplasm of uncertain behavior of skin: Secondary | ICD-10-CM | POA: Diagnosis not present

## 2023-07-17 DIAGNOSIS — D225 Melanocytic nevi of trunk: Secondary | ICD-10-CM | POA: Diagnosis not present

## 2023-08-28 ENCOUNTER — Telehealth: Payer: Self-pay | Admitting: Student

## 2023-08-28 NOTE — Telephone Encounter (Signed)
Patient called with advice from MD.

## 2023-08-28 NOTE — Telephone Encounter (Signed)
 Swaziland, Peter M, MD  You; Charna Elizabeth, LPN2 minutes ago (3:56 PM)   Doesn't need to hold anticoagulation for local skin incision  Peter Swaziland MD, [facility]

## 2023-08-28 NOTE — Telephone Encounter (Signed)
 Patient called and said that he is having minor surgery coming up this week and he said that he told the dermatologist that he was on Eliquis and if he should stop taking it but he said that he told him that he could continue taking it. Would like to talk with Marjie Skiff or nurse in regards to making sure that is the right decision

## 2023-08-28 NOTE — Telephone Encounter (Signed)
 Spoke with patient of Dr. Swaziland.   Patient is having excisional biopsy on chest this week - Thursday. Dr. Yetta Barre [facility]) did not advise him to hold Eliquis.   He would like opinion from Dr. Swaziland or [facility] PA on this  Message routed

## 2023-08-31 DIAGNOSIS — D485 Neoplasm of uncertain behavior of skin: Secondary | ICD-10-CM | POA: Diagnosis not present

## 2023-08-31 DIAGNOSIS — D225 Melanocytic nevi of trunk: Secondary | ICD-10-CM | POA: Diagnosis not present

## 2023-09-13 ENCOUNTER — Encounter: Payer: Self-pay | Admitting: Cardiology

## 2023-09-13 ENCOUNTER — Ambulatory Visit: Attending: Cardiology | Admitting: Cardiology

## 2023-09-13 DIAGNOSIS — G4733 Obstructive sleep apnea (adult) (pediatric): Secondary | ICD-10-CM

## 2023-09-13 DIAGNOSIS — I10 Essential (primary) hypertension: Secondary | ICD-10-CM

## 2023-09-13 NOTE — Patient Instructions (Signed)
 Medication Instructions:  Your physician recommends that you continue on your current medications as directed. Please refer to the Current Medication list given to you today.  *If you need a refill on your cardiac medications before your next appointment, please call your pharmacy*  Follow-Up: At [facility], you and your health needs are our priority.  As part of our continuing mission to provide you with exceptional heart care, we have created designated Provider Care Teams.  These Care Teams include your primary Cardiologist (physician) and Advanced Practice Providers (APPs -  Physician Assistants and Nurse Practitioners) who all work together to provide you with the care you need, when you need it.  Your next appointment:   1 year(s)  The format for your next appointment:   In Person  Provider:   Armanda Magic, MD {  Other Instructions   1st Floor: - Lobby - Registration  - Pharmacy  - Lab - Cafe  2nd Floor: - PV Lab - Diagnostic Testing (echo, CT, nuclear med)  3rd Floor: - Vacant  4th Floor: - TCTS (cardiothoracic surgery) - AFib Clinic - Structural Heart Clinic - Vascular Surgery  - Vascular Ultrasound  5th Floor: - HeartCare Cardiology (general and EP) - Clinical Pharmacy for coumadin, hypertension, lipid, weight-loss medications, and med management appointments    Valet parking services will be available as well.

## 2023-09-13 NOTE — Progress Notes (Signed)
 SLEEP MEDICINE VIRTUAL VISIT via Video Note   Because of Jim Nguyen's co-morbid illnesses, he is at least at moderate risk for complications without adequate follow up.  This format is felt to be most appropriate for this patient at this time.  All issues noted in this document were discussed and addressed.  A limited physical exam was performed with this format.  Please refer to the patient's chart for his consent to telehealth for [facility].       Date:  09/13/2023   ID:  Jim Nguyen, DOB 1951/02/10, MRN [id number] The patient was identified using 2 identifiers.  Patient Location: Home Provider Location: Home Office   PCP:  Rodrigo Ran, MD   [facility] Providers Cardiologist:  Peter Swaziland, MD Cardiology APP:  Corrin Parker, PA-C  Sleep Medicine:  Armanda Magic, MD     Evaluation Performed:  Follow-Up Visit  Chief Complaint:  OSA  History of Present Illness:    Jim Nguyen is a 73 y.o. male with with  a hx of HLD, PAF and HTN who was referred by Dr. Swaziland for home sleep study due to hx of PAF to rule out OSA.  He was concerned that his PAF may be occurring due to undiagnosed OSA.  He had had several episodes of PAF and Dr. Swaziland referred him for home sleep study showing severe OSA with an AHI of 55.6/hr and O2 sats as low as 82%.  He underwent CPAP titration to 7cm H2O and his now here for followup.     He is doing well with his PAP device and thinks that he has gotten used to it.  He tolerates the nasal pillow mask and feels the pressure is adequate.  Since going on PAP he feels rested in the am and has no significant daytime sleepiness.  He denies any significant mouth or nasal dryness or nasal congestion.  He does not think that he snores.     Past Medical History:  Diagnosis Date   Anticoagulant long-term use    Eliquis-- managed by cardiologist   Arthritis    Chronic gout    per pt last episode approx. 2018   GERD  (gastroesophageal reflux disease)    History of adenomatous polyp of colon    Hyperlipidemia    Hypertension    followed by cardiology and pcp   (ETT 10-09-2008 in epic, normal without ischemia)   Inguinal hernia of right side without obstruction or gangrene    OSA on CPAP    severe OSA with an AHI of 55.6/hr and O2 sats as low as 82% on CPAP at 7cm H2O   PAF (paroxysmal atrial fibrillation) (HCC) 05/2019   cardiologist-- dr Swaziland--  long hx palpitations   PONV (postoperative nausea and vomiting)    Past Surgical History:  Procedure Laterality Date   COLONOSCOPY     last one 07/ 2022   INGUINAL HERNIA REPAIR Right 02/12/2021   Procedure: RIGHT INGUINAL HERNIA REPAIR WITH MESH;  Surgeon: Darnell Level, MD;  Location: [facility];  Service: General;  Laterality: Right;   LAPAROSCOPIC CHOLECYSTECTOMY  1995     Current Meds  Medication Sig   allopurinol (ZYLOPRIM) 300 MG tablet Take 300 mg by mouth daily.   apixaban (ELIQUIS) 5 MG TABS tablet Take 1 tablet (5 mg total) by mouth 2 (two) times daily.   atorvastatin (LIPITOR) 20 MG tablet Take 20 mg by mouth at  bedtime.   cholecalciferol (VITAMIN D3) 25 MCG (1000 UNIT) tablet Take 1,000 Units by mouth daily.   diltiazem (CARDIZEM CD) 360 MG 24 hr capsule TAKE 1 CAPSULE BY MOUTH EVERY DAY   flecainide (TAMBOCOR) 100 MG tablet Take 1 tablet (100 mg total) by mouth 2 (two) times daily. Can take an extra 1 tablet as needed for breakthrough atrial fibrillation. Do not exceed 300mg  in a 24 hour period.     Allergies:   Patient has no active allergies.   Social History   Tobacco Use   Smoking status: Former    Current packs/day: 0.00    Average packs/day: 0.5 packs/day for 30.0 years (15.0 ttl pk-yrs)    Types: Cigars, Cigarettes    Start date: 01/21/1986    Quit date: 01/22/2016    Years since quitting: 7.6   Smokeless tobacco: Never  Vaping Use   Vaping status: Never Used  Substance Use Topics   Alcohol use: Yes     Alcohol/week: 10.0 - 12.0 standard drinks of alcohol    Types: 10 - 12 Glasses of wine per week    Comment: 1-2 glass per day   Drug use: No     Family Hx: The patient's family history includes Heart disease in his father; Pancreatic cancer in his father. There is no history of Colon cancer.  ROS:   Please see the history of present illness.     All other systems reviewed and are negative.   Prior Sleep studies:   The following studies were reviewed today:  PAP compliance download  Labs/Other Tests and Data Reviewed:     Recent Labs: 05/02/2023: BUN 15; Creatinine, Ser 0.83; Hemoglobin 15.9; Magnesium 2.2; Platelets 162; Potassium 4.6; Sodium 141   Wt Readings from Last 3 Encounters:  05/02/23 228 lb (103.4 kg)  11/14/22 232 lb 6.4 oz (105.4 kg)  06/21/22 230 lb 9.6 oz (104.6 kg)     Risk Assessment/Calculations:      Objective:    Vital Signs:  There were no vitals taken for this visit.   VITAL SIGNS:  reviewed GEN:  no acute distress EYES:  sclerae anicteric, EOMI - Extraocular Movements Intact RESPIRATORY:  normal respiratory effort, symmetric expansion CARDIOVASCULAR:  no peripheral edema SKIN:  no rash, lesions or ulcers. MUSCULOSKELETAL:  no obvious deformities. NEURO:  alert and oriented x 3, no obvious focal deficit PSYCH:  normal affect  ASSESSMENT & PLAN:    OSA - The patient is tolerating PAP therapy well without any problems. The PAP download performed by his DME was personally reviewed and interpreted by me today and showed an AHI of 1.7/hr on 7 cm H2O with 93% compliance in using more than 4 hours nightly.  The patient has been using and benefiting from PAP use and will continue to benefit from therapy.   HTN -BP controlled at home < 120/43mmHg -continue prescription drug management with Cardizem CD 360mg  daily with PRN refills    Total time of encounter: 20 minutes total time of encounter, including 15 minutes spent in face-to-face patient  care on the date of this encounter. This time includes coordination of care and counseling regarding above mentioned problem list. Remainder of non-face-to-face time involved reviewing chart documents/testing relevant to the patient encounter and documentation in the medical record. I have independently reviewed documentation from referring provider.    Medication Adjustments/Labs and Tests Ordered: Current medicines are reviewed at length with the patient today.  Concerns regarding medicines are outlined above.  Tests Ordered: No orders of the defined types were placed in this encounter.   Medication Changes: No orders of the defined types were placed in this encounter.   Follow Up:  In Person in 1 year(s)  Signed, Armanda Magic, MD  09/13/2023 8:57 AM    [facility]

## 2023-09-27 ENCOUNTER — Other Ambulatory Visit: Payer: Self-pay | Admitting: Cardiology

## 2023-09-27 DIAGNOSIS — I480 Paroxysmal atrial fibrillation: Secondary | ICD-10-CM

## 2023-09-27 MED ORDER — FLECAINIDE ACETATE 100 MG PO TABS: 100.0000 mg | ORAL_TABLET | Freq: Two times a day (BID) | ORAL | 2 refills | Status: DC

## 2023-09-27 MED ORDER — APIXABAN 5 MG PO TABS: 5.0000 mg | ORAL_TABLET | Freq: Two times a day (BID) | ORAL | 1 refills | Status: AC

## 2023-09-27 NOTE — Telephone Encounter (Signed)
 Pt last saw Sharren Decree, PA on 04/30/23, last labs 05/02/23 Creat 0.83, age 73, weight 103.4kg, based on specified criteria pt is on appropriate dosage of Eliquis  5mg  BID for afib.  Will refill rx.

## 2023-09-27 NOTE — Telephone Encounter (Signed)
 RX sent in for Flecainide . RX for Eliquis  routed to Anticoag.

## 2023-09-27 NOTE — Telephone Encounter (Signed)
*  STAT* If patient is at the pharmacy, call can be transferred to refill team.   1. Which medications need to be refilled? (please list name of each medication and dose if known)   apixaban  (ELIQUIS ) 5 MG TABS tablet  flecainide  (TAMBOCOR ) 100 MG tablet   2. Would you like to learn more about the convenience, safety, & potential cost savings by using the [facility]?   3. Are you open to using the [facility] (Type [facility]. ).  4. Which pharmacy/location (including street and city if local pharmacy) is medication to be sent to?  [id number] - [place], [place] - [address] AT [address] OF [address]   5. Do they need a 30 day or 90 day supply?   90 day  Patient stated he will be out of this medication in a couple of days.  Patient has an appointment with Dr. Swaziland scheduled on 6/30.

## 2023-09-30 ENCOUNTER — Other Ambulatory Visit: Payer: Self-pay | Admitting: Cardiology

## 2023-11-23 DIAGNOSIS — Z1212 Encounter for screening for malignant neoplasm of rectum: Secondary | ICD-10-CM | POA: Diagnosis not present

## 2023-11-23 DIAGNOSIS — E7849 Other hyperlipidemia: Secondary | ICD-10-CM | POA: Diagnosis not present

## 2023-11-29 NOTE — Progress Notes (Signed)
 Cardiology Office Note   Date:  12/04/2023   ID:  Jim, Nguyen [birth year], MRN [id number]  PCP:  Shayne Anes, MD  Cardiologist:   Kristeen Lantz [place], MD   Chief Complaint  Patient presents with   Atrial Fibrillation      History of Present Illness: Jim Nguyen is a 73 y.o. male who presents for follow up Afib. He has  a hx of HTN, HLD and atrial fibrillation.  Patient was previously seen  in December 2020 for increased palpitation.  7 day event monitor at the time showed frequent but brief runs of PAT.  Echocardiogram was normal. Due to persistent symptoms he sent in some apple watch tracings that appeared to show atrial fibrillation.  Diltiazem  was increased to 240 mg daily.  .  On 09/11/2019, he has went back into atrial fibrillation from the day before.  This was confirmed the both on EKG and his apple watch.  Given his CHA2DS2-Vasc score of 2,  Eliquis  5 mg twice daily was recommended.   His diltiazem  was increased  to 360 mg daily.   states he has no further hematuria.    He did have a sleep study confirming sleep apnea and he is now on CPAP.  He  has been on CPAP nightly.  Followed by Dr Shlomo for sleep apnea. ETT in July 2024 showed no arrhythmia. This was done for flecainide  surveillance. Did not achieve target HR but no ischemia noted.  On follow up today he is doing well. No AFib for the past 2 years. Tolerating medication well. No bleeding. Uses CPAP regularly.     Past Medical History:  Diagnosis Date   Anticoagulant long-term use    Eliquis -- managed by cardiologist   Arthritis    Chronic gout    per pt last episode approx. 2018   GERD (gastroesophageal reflux disease)    History of adenomatous polyp of colon    Hyperlipidemia    Hypertension    followed by cardiology and pcp   (ETT 10-09-2008 in epic, normal without ischemia)   Inguinal hernia of right side without obstruction or gangrene    OSA on CPAP    severe OSA with an AHI of 55.6/hr and O2 sats as  low as 82% on CPAP at 7cm H2O   PAF (paroxysmal atrial fibrillation) (HCC) 05/2019   cardiologist-- dr [place]--  long hx palpitations   PONV (postoperative nausea and vomiting)     Past Surgical History:  Procedure Laterality Date   COLONOSCOPY     last one 07/ 2022   INGUINAL HERNIA REPAIR Right 02/12/2021   Procedure: RIGHT INGUINAL HERNIA REPAIR WITH MESH;  Surgeon: Eletha Boas, MD;  Location: [facility];  Service: General;  Laterality: Right;   LAPAROSCOPIC CHOLECYSTECTOMY  1995     Current Outpatient Medications  Medication Sig Dispense Refill   allopurinol (ZYLOPRIM) 300 MG tablet Take 300 mg by mouth daily.     apixaban  (ELIQUIS ) 5 MG TABS tablet Take 1 tablet (5 mg total) by mouth 2 (two) times daily. 180 tablet 1   atorvastatin  (LIPITOR) 20 MG tablet Take 20 mg by mouth at bedtime.     cholecalciferol (VITAMIN D3) 25 MCG (1000 UNIT) tablet Take 1,000 Units by mouth daily.     diltiazem  (CARDIZEM  CD) 360 MG 24 hr capsule TAKE 1 CAPSULE BY MOUTH EVERY DAY 90 capsule 3   flecainide  (TAMBOCOR ) 100 MG tablet Take 1 tablet (100 mg total) by  mouth 2 (two) times daily. Can take an extra 1 tablet as needed for breakthrough atrial fibrillation. Do not exceed 300mg  in a 24 hour period. 180 tablet 3   No current facility-administered medications for this visit.    Allergies:   Patient has no active allergies.    Social History:  The patient  reports that he quit smoking about 7 years ago. His smoking use included cigars and cigarettes. He started smoking about 37 years ago. He has a 15 pack-year smoking history. He has never used smokeless tobacco. He reports current alcohol  use of about 10.0 - 12.0 standard drinks of alcohol  per week. He reports that he does not use drugs.   Family History:  The patient's family history includes Heart disease in his father; Pancreatic cancer in his father.    ROS:  Please see the history of present illness.   Otherwise, review of  systems are positive for none.   All other systems are reviewed and negative.    PHYSICAL EXAM: VS:  BP 120/76 (BP Location: Left Arm, Patient Position: Sitting)   Pulse 60   Ht 5' 10 (1.778 m)   Wt 234 lb 3.2 oz (106.2 kg)   SpO2 96%   BMI 33.60 kg/m  , BMI Body mass index is 33.6 kg/m. GEN: Well nourished, overweight, in no acute distress HEENT: normal Neck: no JVD, carotid bruits, or masses Cardiac: IRRR; no murmurs, rubs, or gallops,no edema  Respiratory:  clear to auscultation bilaterally, normal work of breathing GI: soft, nontender, nondistended, + BS MS: no deformity or atrophy Skin: warm and dry, no rash Neuro:  Strength and sensation are intact Psych: euthymic mood, full affect   EKG Interpretation Date/Time:  Monday December 04 2023 07:55:31 EDT Ventricular Rate:  60 PR Interval:  176 QRS Duration:  108 QT Interval:  456 QTC Calculation: 456 R Axis:   14  Text Interpretation: Normal sinus rhythm EKG WITHIN NORMAL LIMITS When compared with ECG of November 14, 2022 No significant change was found Confirmed by [place], Dailey Buccheri [phone]) on 12/04/2023 7:57:17 AM    Recent Labs: 05/02/2023: BUN 15; Creatinine, Ser 0.83; Hemoglobin 15.9; Magnesium 2.2; Platelets 162; Potassium 4.6; Sodium 141    Lipid Panel    Component Value Date/Time   CHOL 125 03/29/2011 0836   TRIG 141.0 03/29/2011 0836   HDL 33.50 (L) 03/29/2011 0836   CHOLHDL 4 03/29/2011 0836   VLDL 28.2 03/29/2011 0836   LDLCALC 63 03/29/2011 0836    Dated 08/31/20: cholesterol 126, triglycerides 152, HDL 41, LDL 55. A1c 5.5%. CMET and TSH normal. Dated 02/12/21: CBC normal. Dated 09/01/21: cholesterol 118, triglycerides 128, HDL 40, LDL 52. A1c 5.7%. CMET, TSH and PSA normal.  Dated 09/13/22: cholesterol 118, triglycerides 116, HDL 42, LDL 53. A1c 5.7%. TSH and CMET Ok  Wt Readings from Last 3 Encounters:  12/04/23 234 lb 3.2 oz (106.2 kg)  05/02/23 228 lb (103.4 kg)  11/14/22 232 lb 6.4 oz (105.4 kg)       Other studies Reviewed: Additional studies/ records that were reviewed today include:  Echo 04/15/2019 IMPRESSIONS     1. Left ventricular ejection fraction, by visual estimation, is 65 to  70%. The left ventricle has hyperdynamic function. There is no left  ventricular hypertrophy.   2. Left ventricular diastolic parameters are consistent with Grade I  diastolic dysfunction (impaired relaxation).   3. Global right ventricle has normal systolic function.The right  ventricular size is normal. No increase in right  ventricular wall  thickness.   4. Left atrial size was mildly dilated.   5. Right atrial size was normal.   6. Mild mitral annular calcification.   7. The mitral valve is abnormal. Trace mitral valve regurgitation.   8. The tricuspid valve is normal in structure. Tricuspid valve  regurgitation is mild.   9. The aortic valve is tricuspid. Aortic valve regurgitation is not  visualized. Mild aortic valve sclerosis without stenosis.  10. The pulmonic valve was normal in structure. Pulmonic valve  regurgitation is trivial.    ETT 12/21/22: Study Highlights      No ST deviation was noted.   ETT with fair exercise tolerance (6:00); no chest pain; normal blood pressure response; no diagnostic ST changes; no exercise-induced arrhythmias (patient on flecainide ); negative inadequate exercise treadmill (patient did not achieve target heart rate-65 % predicted THR).  ASSESSMENT AND PLAN:  1.  PAF: on diltiazem  for rate control and Eliquis  for anticoagulation. He has a great response to Flecainide  with no recurrent Afib.  Continue 100 mg  bid.  Encourage continued efforts at weight loss. If he has significant break through on AAD therapy then I would refer for ablation.    Hypertension: Blood is well controlled.    Hyperlipidemia: On Lipitor. Last LDL 53. Seeing Dr Shayne this week with updated labs.    4.   OSA now on CPAP   Current medicines are reviewed at length with the  patient today.  The patient does not have concerns regarding medicines.  The following changes have been made: none   Labs/ tests ordered today include:   Orders Placed This Encounter  Procedures   EKG 12-Lead         Disposition:   FU with me in one year  Signed, Lalah Durango [place], MD  12/04/2023 8:11 AM    [facility] [address] [place], [place], [address] Phone [phone], Fax [phone]

## 2023-12-01 ENCOUNTER — Telehealth: Payer: Self-pay | Admitting: Cardiology

## 2023-12-01 DIAGNOSIS — I10 Essential (primary) hypertension: Secondary | ICD-10-CM

## 2023-12-01 DIAGNOSIS — G4733 Obstructive sleep apnea (adult) (pediatric): Secondary | ICD-10-CM

## 2023-12-01 NOTE — Telephone Encounter (Signed)
 Pt calling bc he will be traveling and wants to know if he should get a mouth piece, requesting cb to further discuss

## 2023-12-04 ENCOUNTER — Ambulatory Visit: Attending: Cardiology | Admitting: Cardiology

## 2023-12-04 ENCOUNTER — Encounter: Payer: Self-pay | Admitting: Cardiology

## 2023-12-04 VITALS — BP 120/76 | HR 60 | Ht 70.0 in | Wt 234.2 lb

## 2023-12-04 DIAGNOSIS — E7800 Pure hypercholesterolemia, unspecified: Secondary | ICD-10-CM

## 2023-12-04 DIAGNOSIS — G4733 Obstructive sleep apnea (adult) (pediatric): Secondary | ICD-10-CM | POA: Diagnosis not present

## 2023-12-04 DIAGNOSIS — I10 Essential (primary) hypertension: Secondary | ICD-10-CM | POA: Diagnosis not present

## 2023-12-04 DIAGNOSIS — I480 Paroxysmal atrial fibrillation: Secondary | ICD-10-CM

## 2023-12-04 NOTE — Patient Instructions (Signed)
 Medication Instructions:  Continue same medications *If you need a refill on your cardiac medications before your next appointment, please call your pharmacy*  Lab Work: None ordered  Testing/Procedures: None ordered  Follow-Up: At [facility], you and your health needs are our priority.  As part of our continuing mission to provide you with exceptional heart care, our providers are all part of one team.  This team includes your primary Cardiologist (physician) and Advanced Practice Providers or APPs (Physician Assistants and Nurse Practitioners) who all work together to provide you with the care you need, when you need it.  Your next appointment:  1 year     Call in March to schedule June appointment    Provider:  Dr.Jordan   We recommend signing up for the patient portal called MyChart.  Sign up information is provided on this After Visit Summary.  MyChart is used to connect with patients for Virtual Visits (Telemedicine).  Patients are able to view lab/test results, encounter notes, upcoming appointments, etc.  Non-urgent messages can be sent to your provider as well.   To learn more about what you can do with MyChart, go to [email or web address].

## 2023-12-07 DIAGNOSIS — I10 Essential (primary) hypertension: Secondary | ICD-10-CM | POA: Diagnosis not present

## 2023-12-07 DIAGNOSIS — R82998 Other abnormal findings in urine: Secondary | ICD-10-CM | POA: Diagnosis not present

## 2023-12-11 DIAGNOSIS — Z1212 Encounter for screening for malignant neoplasm of rectum: Secondary | ICD-10-CM | POA: Diagnosis not present

## 2023-12-11 DIAGNOSIS — E7849 Other hyperlipidemia: Secondary | ICD-10-CM | POA: Diagnosis not present

## 2024-01-09 DIAGNOSIS — J3489 Other specified disorders of nose and nasal sinuses: Secondary | ICD-10-CM | POA: Diagnosis not present

## 2024-01-09 DIAGNOSIS — R058 Other specified cough: Secondary | ICD-10-CM | POA: Diagnosis not present

## 2024-01-09 DIAGNOSIS — I480 Paroxysmal atrial fibrillation: Secondary | ICD-10-CM | POA: Diagnosis not present

## 2024-01-09 DIAGNOSIS — G4733 Obstructive sleep apnea (adult) (pediatric): Secondary | ICD-10-CM | POA: Diagnosis not present

## 2024-01-12 NOTE — Addendum Note (Signed)
 Addended by: JOSHUA DALTON MATSU on: 01/12/2024 04:55 PM   Modules accepted: Orders

## 2024-01-12 NOTE — Telephone Encounter (Signed)
 Called patient and informed him his Rx for a travel cpap is in his chart when he is ready for it. He did not have a dme for me to send it to at this time.

## 2024-02-13 ENCOUNTER — Other Ambulatory Visit (HOSPITAL_COMMUNITY): Payer: Self-pay

## 2024-02-15 ENCOUNTER — Telehealth: Payer: Self-pay | Admitting: Cardiology

## 2024-02-15 NOTE — Telephone Encounter (Signed)
 Pt calling to inquire about starting Zepbound. Wanted to discuss potential side effects and medication interactions. Please advise.

## 2024-02-16 NOTE — Telephone Encounter (Signed)
 Pt c/o medication issue:  1. Name of Medication:   Zepbound  2. How are you currently taking this medication (dosage and times per day)?   Not started yet  3. Are you having a reaction (difficulty breathing--STAT)?   4. What is your medication issue?   Patient stated he was prescribed this medication and wants to know if it would be safe for him to take this.  Patient wants to start taking this medication tomorrow (9/13).

## 2024-02-16 NOTE — Telephone Encounter (Signed)
 Called patient back about message. Informed him that the PharmD advised that it was fine to take Zepbound with his other medications. Patient verbalized understanding.

## 2024-02-16 NOTE — Telephone Encounter (Signed)
Yes, this is fine for him to take.

## 2024-02-16 NOTE — Telephone Encounter (Signed)
Will send to pharm D for advisement.

## 2024-03-25 DIAGNOSIS — H2513 Age-related nuclear cataract, bilateral: Secondary | ICD-10-CM | POA: Diagnosis not present

## 2024-04-17 DIAGNOSIS — R197 Diarrhea, unspecified: Secondary | ICD-10-CM | POA: Diagnosis not present

## 2024-04-17 DIAGNOSIS — G4733 Obstructive sleep apnea (adult) (pediatric): Secondary | ICD-10-CM | POA: Diagnosis not present

## 2024-04-17 DIAGNOSIS — E669 Obesity, unspecified: Secondary | ICD-10-CM | POA: Diagnosis not present

## 2024-04-17 DIAGNOSIS — I10 Essential (primary) hypertension: Secondary | ICD-10-CM | POA: Diagnosis not present

## 2024-04-17 DIAGNOSIS — R7301 Impaired fasting glucose: Secondary | ICD-10-CM | POA: Diagnosis not present

## 2024-04-17 DIAGNOSIS — K219 Gastro-esophageal reflux disease without esophagitis: Secondary | ICD-10-CM | POA: Diagnosis not present

## 2024-04-30 DIAGNOSIS — G4733 Obstructive sleep apnea (adult) (pediatric): Secondary | ICD-10-CM | POA: Diagnosis not present

## 2024-05-23 DIAGNOSIS — M19072 Primary osteoarthritis, left ankle and foot: Secondary | ICD-10-CM | POA: Diagnosis not present

## 2024-06-26 ENCOUNTER — Telehealth: Payer: Self-pay | Admitting: Cardiology

## 2024-06-26 NOTE — Telephone Encounter (Signed)
Spoke to patient PharmD's advice given. °

## 2024-06-26 NOTE — Telephone Encounter (Signed)
 Spoke to patient he stated he saw orthopedic Dr. yesterday for right hip pain.He prescribed Prednisone 50 mg daily for 5 days and Tramadol  as needed.He wanted to make sure ok to take with other medications.Advised I will send message to our Pharmacy team for advice.

## 2024-06-26 NOTE — Telephone Encounter (Signed)
 No interactions between prednisone or tramadol  with flecainide 

## 2024-06-26 NOTE — Telephone Encounter (Signed)
 Pt c/o medication issue:  1. Name of Medication: 50 mg Prednisone   2. How are you currently taking this medication (dosage and times per day)? Unknown   3. Are you having a reaction (difficulty breathing--STAT)? No   4. What is your medication issue? Pt was prescribed medication through orthopedic Dr and would like to know if medication is ok to take with Flecainide .  Pt would like a call back please advise

## 2024-06-26 NOTE — Telephone Encounter (Signed)
 Patient called to follow-up on if he should take Prednisone with his flecainide  (TAMBOCOR ) 100 MG tablet as he is having a lot of pain.

## 2024-06-30 ENCOUNTER — Other Ambulatory Visit: Payer: Self-pay | Admitting: Cardiology

## 2024-11-05 ENCOUNTER — Ambulatory Visit: Admitting: Cardiology
